# Patient Record
Sex: Male | Born: 1980 | Race: White | Hispanic: No | Marital: Married | Smoking: Current every day smoker
Health system: Southern US, Community
[De-identification: ages and names within clinical notes are randomized; demographics above are authoritative.]

## PROBLEM LIST (undated history)

## (undated) DIAGNOSIS — N200 Calculus of kidney: Secondary | ICD-10-CM

## (undated) DIAGNOSIS — R569 Unspecified convulsions: Secondary | ICD-10-CM

---

## 2006-06-21 ENCOUNTER — Emergency Department (HOSPITAL_COMMUNITY): Admission: EM | Admit: 2006-06-21 | Discharge: 2006-06-21 | Payer: Self-pay | Admitting: Emergency Medicine

## 2006-12-12 ENCOUNTER — Emergency Department (HOSPITAL_COMMUNITY): Admission: EM | Admit: 2006-12-12 | Discharge: 2006-12-12 | Payer: Self-pay | Admitting: Emergency Medicine

## 2007-01-28 ENCOUNTER — Emergency Department (HOSPITAL_COMMUNITY): Admission: EM | Admit: 2007-01-28 | Discharge: 2007-01-28 | Payer: Self-pay | Admitting: *Deleted

## 2007-04-19 ENCOUNTER — Emergency Department (HOSPITAL_COMMUNITY): Admission: EM | Admit: 2007-04-19 | Discharge: 2007-04-19 | Payer: Self-pay | Admitting: Emergency Medicine

## 2007-04-25 ENCOUNTER — Emergency Department (HOSPITAL_COMMUNITY): Admission: EM | Admit: 2007-04-25 | Discharge: 2007-04-25 | Payer: Self-pay | Admitting: Emergency Medicine

## 2007-04-27 ENCOUNTER — Emergency Department (HOSPITAL_COMMUNITY): Admission: EM | Admit: 2007-04-27 | Discharge: 2007-04-27 | Payer: Self-pay | Admitting: Emergency Medicine

## 2007-05-13 ENCOUNTER — Emergency Department (HOSPITAL_COMMUNITY): Admission: EM | Admit: 2007-05-13 | Discharge: 2007-05-13 | Payer: Self-pay | Admitting: Emergency Medicine

## 2012-01-13 ENCOUNTER — Ambulatory Visit: Payer: Self-pay | Admitting: Family Medicine

## 2012-01-13 VITALS — BP 152/72 | HR 71 | Temp 98.4°F | Resp 16 | Ht 75.0 in | Wt 238.0 lb

## 2012-01-13 DIAGNOSIS — M549 Dorsalgia, unspecified: Secondary | ICD-10-CM

## 2012-01-13 DIAGNOSIS — N200 Calculus of kidney: Secondary | ICD-10-CM

## 2012-01-13 LAB — POCT UA - MICROSCOPIC ONLY
Mucus, UA: NEGATIVE
WBC, Ur, HPF, POC: NEGATIVE

## 2012-01-13 LAB — POCT URINALYSIS DIPSTICK
Bilirubin, UA: NEGATIVE
Ketones, UA: NEGATIVE
Leukocytes, UA: NEGATIVE
pH, UA: 6

## 2012-01-13 MED ORDER — HYDROCODONE-ACETAMINOPHEN 5-500 MG PO TABS: 1.0000 | ORAL_TABLET | Freq: Three times a day (TID) | ORAL | Status: AC | PRN

## 2012-01-13 MED ORDER — TAMSULOSIN HCL 0.4 MG PO CAPS: 0.4000 mg | ORAL_CAPSULE | Freq: Every day | ORAL | Status: DC

## 2012-01-13 MED ORDER — NAPROXEN 500 MG PO TABS: 500.0000 mg | ORAL_TABLET | Freq: Two times a day (BID) | ORAL | Status: DC

## 2012-01-13 NOTE — Progress Notes (Signed)
  Subjective:    Patient ID: Allen Ferrell, male    DOB: [birth year], 31 y.o.   MRN: [id number]  HPI 31 yo male with question of kidney stone. Never had before.  2 days of right sided back pain, sharp, comes and goes.  Seems to be moving down.  Also, dysuria.  No fevers.  Slight nausea.  No abdominal pain.    Review of Systems Negative except as per HPI     Objective:   Physical Exam  Constitutional: Vital signs are normal. He appears well-developed and well-nourished. He is active.  Cardiovascular: Normal rate, regular rhythm, normal heart sounds and normal pulses.   Pulmonary/Chest: Effort normal and breath sounds normal.  Abdominal: Soft. Normal appearance and bowel sounds are normal. He exhibits no distension and no mass. There is no hepatosplenomegaly. There is no tenderness. There is no rigidity, no rebound, no guarding, no CVA tenderness, no tenderness at McBurney's point and negative Murphy's sign. No hernia.  Neurological: He is alert.    Positive ttp right lower back and CVA  Results for orders placed in visit on 01/13/12  POCT UA - MICROSCOPIC ONLY      Component Value Range   WBC, Ur, HPF, POC neg     RBC, urine, microscopic 15-20     Bacteria, U Microscopic trace     Mucus, UA neg     Epithelial cells, urine per micros 0-2     Crystals, Ur, HPF, POC neg     Casts, Ur, LPF, POC neg     Yeast, UA neg    POCT URINALYSIS DIPSTICK      Component Value Range   Color, UA yellow     Clarity, UA hazy     Glucose, UA neg     Bilirubin, UA neg     Ketones, UA neg     Spec Grav, UA 1.025     Blood, UA moderate     pH, UA 6.0     Protein, UA neg     Urobilinogen, UA 0.2     Nitrite, UA neg     Leukocytes, UA Negative         Assessment & Plan:  BAck pain, dysuria, hematuria - likely kidney stone.  Flomax, naproxyn, vicodin, fluids.  Discussed warning signs to return.  RTC if not passed in 1 week.

## 2012-01-14 ENCOUNTER — Telehealth: Payer: Self-pay

## 2012-01-14 NOTE — Telephone Encounter (Signed)
Pt wife state pt is in extreme pain he was seen 01-13-12 given rx for pain he has increased the dose by taking 2 at a time, he slept for awhile and still no relief, pt wife states they are from out of town and came to [place] for funeral, states they dont have any extra money and would like to see if something could be called [facility] [address]

## 2012-01-14 NOTE — Telephone Encounter (Signed)
He should RTC. ? Toradol injection.  Next pain medication that is stronger is Percocet and they would need to come in for that.

## 2012-01-14 NOTE — Telephone Encounter (Signed)
PT CALLED BACK WITH A BETTER NUMBER, [phone]

## 2012-01-16 ENCOUNTER — Other Ambulatory Visit: Payer: Self-pay | Admitting: Family Medicine

## 2012-01-16 MED ORDER — OXYCODONE-ACETAMINOPHEN 5-325 MG PO TABS: 1.0000 | ORAL_TABLET | Freq: Three times a day (TID) | ORAL | Status: AC | PRN

## 2012-01-16 MED ORDER — OXYCODONE-ACETAMINOPHEN 5-325 MG PO TABS: 1.0000 | ORAL_TABLET | Freq: Three times a day (TID) | ORAL | Status: DC | PRN

## 2012-01-16 NOTE — Telephone Encounter (Signed)
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  If that is no help, he will need to go to the ED. It can't be called.  They have to come in and pick it up.

## 2012-01-16 NOTE — Progress Notes (Signed)
Previous RX accidentally signed by another provider.  Original shredded and this one reprinted.

## 2012-01-16 NOTE — Telephone Encounter (Signed)
Advised pt that rx is ready for pick up.

## 2012-01-16 NOTE — Telephone Encounter (Signed)
Pt stated that he is still having pain and have not passed it yet.  He does not have the funds to come back in.  Would we be able to maybe refill pain med, he stated that it helped only a little bit.

## 2012-03-23 ENCOUNTER — Encounter (HOSPITAL_COMMUNITY): Payer: Self-pay | Admitting: *Deleted

## 2012-03-23 ENCOUNTER — Emergency Department (HOSPITAL_COMMUNITY)
Admission: EM | Admit: 2012-03-23 | Discharge: 2012-03-23 | Disposition: A | Discharge status: Home / Self Care | Payer: Self-pay | Attending: Emergency Medicine | Admitting: Emergency Medicine

## 2012-03-23 DIAGNOSIS — H9209 Otalgia, unspecified ear: Secondary | ICD-10-CM | POA: Insufficient documentation

## 2012-03-23 DIAGNOSIS — H6690 Otitis media, unspecified, unspecified ear: Secondary | ICD-10-CM

## 2012-03-23 DIAGNOSIS — F17200 Nicotine dependence, unspecified, uncomplicated: Secondary | ICD-10-CM | POA: Insufficient documentation

## 2012-03-23 MED ORDER — OFLOXACIN 0.3 % OP SOLN
5.0000 [drp] | Freq: Two times a day (BID) | OPHTHALMIC | Status: DC
  Administered 2012-03-23: 5 [drp] via OTIC
  Filled 2012-03-23: qty 5

## 2012-03-23 MED ORDER — ANTIPYRINE-BENZOCAINE 5.4-1.4 % OT SOLN
3.0000 [drp] | Freq: Once | OTIC | Status: AC
  Administered 2012-03-23: 3 [drp] via OTIC
  Filled 2012-03-23: qty 10

## 2012-03-23 MED ORDER — OFLOXACIN 0.3 % OT SOLN
5.0000 [drp] | Freq: Two times a day (BID) | OTIC | Status: DC
  Filled 2012-03-23: qty 5

## 2012-03-23 NOTE — ED Provider Notes (Signed)
History     CSN: [id number]  Arrival date & time 03/23/12  1341   First MD Initiated Contact with Patient 03/23/12 1528      Chief Complaint  Patient presents with  . Otalgia    (Consider location/radiation/quality/duration/timing/severity/associated sxs/prior treatment) HPI  31 year old male presents complaining of left ear pain. Pain is been ongoing for the past 3 days after he was cleaning his ear with a Q-tip. Patient reports hearing a muffled sound from his ear and also reports pus and blood drainage from the same ear.  Today patient felt dizzy with subjective fever and chills. He denies headache, pain when laying on his left ear, jaw pain, sinus pain, rash, or recent trauma with the exception of using Q-tip. He has not tried anything to alleviate his symptoms.  History reviewed. No pertinent past medical history.  History reviewed. No pertinent past surgical history.  History reviewed. No pertinent family history.  History  Substance Use Topics  . Smoking status: Current Everyday Smoker -- .5 years    Types: Cigarettes  . Smokeless tobacco: Not on file  . Alcohol Use: Yes     rarely      Review of Systems  All other systems reviewed and are negative.    Allergies  Review of patient's allergies indicates no known allergies.  Home Medications  No current outpatient prescriptions on file.  BP 136/68  Pulse 51  Temp 98.1 F (36.7 C) (Oral)  Resp 18  SpO2 100%  Physical Exam  Nursing note and vitals reviewed. Constitutional: He is oriented to person, place, and time. He appears well-developed and well-nourished. No distress.  HENT:  Head: Normocephalic and atraumatic.  Right Ear: External ear normal.  Left Ear: No drainage, swelling or tenderness. No foreign bodies. No mastoid tenderness. A middle ear effusion is present. Decreased hearing is noted.  Ears:  Mouth/Throat: Oropharynx is clear and moist. No oropharyngeal exudate.  Eyes: Conjunctivae are  normal.  Neck: Normal range of motion. Neck supple.  Pulmonary/Chest: Effort normal. No respiratory distress.  Lymphadenopathy:    He has no cervical adenopathy.  Neurological: He is alert and oriented to person, place, and time.  Skin: Skin is warm. No rash noted.    ED Course  Procedures (including critical care time)  Labs Reviewed - No data to display No results found.   No diagnosis found.    MDM  L ear pain due to recent Q-tip use.  Although TM were obscured by impacted cerumen, due to his sxs this does suggest AOM with possible TM perforation.  No evidence of Otitis Externa.  Will treat with Floxin otic and antipyrine.  Referral given.  Pt recommend avoid q-tip.  Pt voice understanding and agrees with plan.    Doubt mastoiditis, acoustic neuritis, or TMJ.  No fb seen.          Fayrene Helper, PA-C 03/23/12 1551

## 2012-03-23 NOTE — ED Notes (Signed)
Pt c/o left ear pain that began on Monday after cleaning ear with q-tip. Pt now reports muffled sounds from ear and reports blood draining and pus.

## 2012-03-23 NOTE — ED Provider Notes (Signed)
Medical screening examination/treatment/procedure(s) were performed by non-physician practitioner and as supervising physician I was immediately available for consultation/collaboration.  Maxi Rodas R. Robin Pafford, MD 03/23/12 2349 

## 2012-03-23 NOTE — Discharge Instructions (Signed)
Apply 3-4 drops of floxin otic ear drops to your left ear twice daily for the next 5 days.  You may apply antipyrine 2-3 drops every 6 hours for pain as needed.  If your symptom worsen, follow up with Dr. Jearld Fenton.  Avoid using Q-tip to clean ear.    Draining Ear Ear wax, pus, blood and other fluids are examples of the different types of drainage from ears. Drops or cream may be needed to lessen the itching which may occur with ear drainage. CAUSES   Skin irritations in the ear.   Ear infection.   Swimmer's ear.   Ruptured eardrum.   Foreign object in the ear canal.   Sudden pressure changes.   Head injury.  HOME CARE INSTRUCTIONS   Only take over-the-counter or prescription medicines for pain, fever, or discomfort as directed by your caregiver.   Do not rub the ear canal with cotton-tipped swabs.   Do not swim until your caregiver says it is okay.   Before you take a shower, cover a cotton ball with petroleum jelly to keep water out.   Limit exposure to smoke. Secondhand smoke can increase the chance for ear infections.   Keep up with immunizations.   Wash your hands well.   Keep all follow-up appointments to examine the ear and evaluate hearing.  SEEK MEDICAL CARE IF:   You have increased drainage.   You have ear pain, a fever, or drainage that is not getting better after 48 hours of antibiotics.   You are unusually tired.  SEEK IMMEDIATE MEDICAL CARE IF:  You have severe ear pain or headache.   The patient is older than 3 months with a rectal or oral temperature of 102 F (38.9 C) or higher.   The patient is 69 months old or younger with a rectal temperature of 100.4 F (38 C) or higher.   You vomit.   You feel dizzy.   You have a seizure.   You have new hearing loss.  MAKE SURE YOU:   Understand these instructions.   Will watch your condition.   Will get help right away if you are not doing well or get worse.  Document Released: 09/20/2005 Document  Revised: 09/09/2011 Document Reviewed: 07/24/2009 [facility] Patient Information 2012 [place], [place].  RESOURCE GUIDE  Chronic Pain Problems: Contact [facility]  [phone] Patients need to be referred by their primary care doctor.  Insufficient Money for Medicine: Contact United Way:  call "211" or [facility] 641-247-5871.  No Primary Care Doctor: - Call Health Connect  [phone] - can help you locate a primary care doctor that  accepts your insurance, provides certain services, etc. - Physician Referral [phone]  Agencies that provide inexpensive medical care: - [facility]  [phone] - [facility]  [phone] - [facility]  [phone] [facility]  [phone] - [facility]  [phone] [facility]  [phone]  Medicaid-accepting [facility] Providers: - [facility]- [address]  [phone], Mon-Fri 9am-7pm, Sat 9am-1pm - [facility]- [address] [place], [address] [place]  [phone] - [facility]- [address] [place]  [phone] [facility]- [address]  [phone] - Renaye Rakers- [address] [place], [address], [phone]  Only accepts [place] patients after they have their name  applied to their card  Self  Pay (no insurance) in [facility]: - Sickle Cell Patients: Dr Willey Blade, [facility]  [address] [place], [phone] - [facility]- [address] [place]  [phone]       [facility] [place]- [address]       -     [facility]- see information above (Speak to [employer] if you do not have insurance)       -  [facility]- [address] [place], [phone]       -  [facility]- [address],  [phone]       -  [facility]- [address] [phone]       -   Dr Julio Sicks-  [address], [place], [phone]       -  [facility]- [address], [phone]       -  [facility]- [address] [phone], also [address] [phone]       -    [facility]- [address] [place], [phone], 1st & 3rd Saturday   every month, 10am-1pm  1) Find a Doctor and Pay Out of Pocket Although you won't have to find out who is covered by your insurance plan, it is a good idea to ask around and get recommendations. You will then need to call the office and see if the doctor you have chosen will accept you as a new patient and what types of options they offer for patients who are self-pay. Some doctors offer discounts or will set up payment plans for their patients who do not have insurance, but you will need to ask so you aren't surprised when you get to your appointment.  2) Contact Your Local Health Department Not all health departments have doctors that can see patients for sick visits, but many do, so it is worth a call to see if yours does. If you don't know where your local health department is, you can check in your phone book. The CDC also has a tool to help you locate your state's health department, and many state websites also have listings of all of their local health departments.  3) Find a Walk-in Clinic If your illness is not likely to be very severe or complicated, you may want to try a walk in clinic. These are popping up all over the country in pharmacies, drugstores, and shopping centers. They're usually staffed by nurse practitioners or physician assistants that have been trained to treat common illnesses and complaints. They're usually fairly quick and inexpensive. However, if you have serious medical issues or chronic medical problems, these are probably not your best option  STD Testing - [facility] [address] Department of [facility] [place], STD Clinic, [address] [place],  phone [phone] or [phone].  Monday - Friday, call for an appointment. [facility] of [employer], STD Clinic, [address], [place], phone [phone] or [phone].  Monday - Friday, call for an appointment.  Abuse/Neglect: [facility] Child Abuse Hotline 2261909424 [facility] Child Abuse Hotline 979-609-2976 (After Hours)  Emergency Shelter:  [facility] (913) 104-8211  Maternity Homes: - [facility] [place] [facility] ([phone] - [facility] 7474037400  MRSA Hotline #:   843-075-7555  Aaron Edelman  [employer]  [facility] [place]  United [employer] [facility] Dept. [address]                 [address]         [address] [place] [address]  Blondell Reveal Phone:  [phone]                                  Phone:  [phone]                   Phone:  [phone]  [facility], [phone] - [facility] - [phone]       -     [facility] in [address], [address],                                  423-222-2133, [facility] Child Abuse Hotline (613) 420-0687 or (609)349-8732 (After Hours)   Behavioral Health Services  Substance Abuse Resources: - [facility] and Drug Services  616-875-4059 - [facility] 639-857-4823 - [facility] [place] [phone] Floydene Flock (615)[phone] - Residential & Outpatient Substance Abuse Program  ([phone]  Psychological Services: [facility]  [phone] Services  [phone] - [facility], [phone] [place]. [address] [place], ACCESS LINE: 323-347-9804 or 858-261-6181, [employer]  Dental Assistance  If unable to pay or uninsured, contact:   [facility] or [facility]. to become qualified for the adult dental clinic.  Patients with Medicaid: [facility] [phone], [phone] [address] [phone]  If unable to pay, or uninsured, contact [facility] 713-502-4528) or [facility] [phone] in [place], [phone] in [facility]) to become qualified for the adult dental clinic  Other Low-Cost Community Dental Services: - [facility]- [address] [place], [place], [place], [address], [phone], [phone], 2nd and 4th Thursday of the month at 6:30am.  10 clients each day by appointment, can sometimes see walk-in patients if someone does not show for an appointment. [facility]- [address] [place], [place], [address], [phone] - [facility]- [address] [place] [address] [place], [place], [address], [phone] - [facility]- [phone] [facility] Department- [phone] [facility] [phone] -

## 2012-06-05 ENCOUNTER — Encounter (HOSPITAL_COMMUNITY): Payer: Self-pay | Admitting: *Deleted

## 2012-06-05 ENCOUNTER — Emergency Department (HOSPITAL_COMMUNITY): Payer: Self-pay

## 2012-06-05 ENCOUNTER — Emergency Department (HOSPITAL_COMMUNITY)
Admission: EM | Admit: 2012-06-05 | Discharge: 2012-06-05 | Disposition: A | Discharge status: Home / Self Care | Payer: Self-pay | Attending: Emergency Medicine | Admitting: Emergency Medicine

## 2012-06-05 DIAGNOSIS — S6991XA Unspecified injury of right wrist, hand and finger(s), initial encounter: Secondary | ICD-10-CM

## 2012-06-05 DIAGNOSIS — W230XXA Caught, crushed, jammed, or pinched between moving objects, initial encounter: Secondary | ICD-10-CM | POA: Insufficient documentation

## 2012-06-05 DIAGNOSIS — S6990XA Unspecified injury of unspecified wrist, hand and finger(s), initial encounter: Secondary | ICD-10-CM | POA: Insufficient documentation

## 2012-06-05 DIAGNOSIS — F17200 Nicotine dependence, unspecified, uncomplicated: Secondary | ICD-10-CM | POA: Insufficient documentation

## 2012-06-05 MED ORDER — OXYCODONE-ACETAMINOPHEN 5-325 MG PO TABS
2.0000 | ORAL_TABLET | Freq: Once | ORAL | Status: AC
  Administered 2012-06-05: 2 via ORAL
  Filled 2012-06-05: qty 2

## 2012-06-05 MED ORDER — OXYCODONE-ACETAMINOPHEN 5-325 MG PO TABS: 1.0000 | ORAL_TABLET | Freq: Four times a day (QID) | ORAL | Status: AC | PRN

## 2012-06-05 MED ORDER — IBUPROFEN 800 MG PO TABS: 800.0000 mg | ORAL_TABLET | Freq: Three times a day (TID) | ORAL | Status: AC

## 2012-06-05 NOTE — ED Provider Notes (Signed)
Medical screening examination/treatment/procedure(s) were performed by non-physician practitioner and as supervising physician I was immediately available for consultation/collaboration.   Layana Konkel M [place] Madding, DO 06/05/12 1921 

## 2012-06-05 NOTE — Discharge Instructions (Signed)
If you notice increase swelling, warmth, numbness, tingling,  or inability to use your hand return to the ER!  Hand Injuries Minor fractures, sprains, bruises and burns of the hand are often managed in much the same way:  Elevation of your hand above the level of your heart for a few days after the injury, until the pain and swelling improve.   The use of hand dressings and splints to reduce motion, relieve painand prevent reinjury. The dressing and splint should not be removed without the caregiver's approval.   Application of ice packs for 20 to 30 minutes every few hours for 2 to 3 days to reduce pain and swelling due to fractures, sprains, and deep bruises.   The use of medicine to reduce pain and inflammation.  Early motion exercises are sometimes needed to reduce joint stiffness after a hand injury. However, your hand should not be used for any activities that increase pain. Document Released: 10/28/2004 Document Revised: 09/09/2011 Document Reviewed: 12/20/2008 [facility] Patient Information 2012 [place], [place].

## 2012-06-05 NOTE — ED Notes (Signed)
Last night moving refrig, tipped and caught rt hand between it and wall, pain with movement

## 2012-06-05 NOTE — ED Provider Notes (Signed)
History     CSN: [id number]  Arrival date & time 06/05/12  1045   First MD Initiated Contact with Patient 06/05/12 1235      Chief Complaint  Patient presents with  . Hand Injury    rt    (Consider location/radiation/quality/duration/timing/severity/associated sxs/prior treatment) HPI Comments: Allen Ferrell 31 y.o. male   The chief complaint is: Patient presents with:   Hand Injury - rt   The patient has medical history significant for:   History reviewed. No pertinent past medical history.  Patient presents with right hand injury after moving a refrigerator. Patient states that he caught his hand between the refrigerator and the wall and experienced extreme pain. He decribes it as 10/10 and throbbing. Patient history is significant for an old boxers fracture. Denies constitutional symptoms. Denies numbness or tingling. Denies decrease ROM. Denies loss of sensation.     The history is provided by the patient.    History reviewed. No pertinent past medical history.  History reviewed. No pertinent past surgical history.  No family history on file.  History  Substance Use Topics  . Smoking status: Current Everyday Smoker -- .5 years    Types: Cigarettes  . Smokeless tobacco: Not on file  . Alcohol Use: Yes     rarely      Review of Systems  Constitutional: Negative for fever, chills and diaphoresis.  Musculoskeletal: Positive for joint swelling and arthralgias.  Neurological: Negative for numbness.  All other systems reviewed and are negative.    Allergies  Review of patient's allergies indicates no known allergies.  Home Medications   Current Outpatient Rx  Name Route Sig Dispense Refill  . ACETAMINOPHEN 500 MG PO TABS Oral Take 1,500 mg by mouth every 6 (six) hours as needed. For pain      BP 137/71  Pulse 66  Temp 98.1 F (36.7 C) (Oral)  SpO2 100%  Physical Exam  Nursing note and vitals reviewed. Constitutional: He appears well-developed  and well-nourished. He appears distressed.  HENT:  Head: Normocephalic and atraumatic.  Mouth/Throat: Oropharynx is clear and moist.  Eyes: Conjunctivae are normal. No scleral icterus.  Cardiovascular: Normal rate, regular rhythm and normal heart sounds.   Pulmonary/Chest: Effort normal and breath sounds normal.  Abdominal: Soft. Bowel sounds are normal. There is no tenderness.  Musculoskeletal: Normal range of motion. He exhibits edema and tenderness.       Patient has normal flexion and extension of the fingers. Normal ROM of the wrist with strong radial pulse and good cap refill.  Neurological: He is alert.  Skin: Skin is warm and dry.    ED Course  Procedures (including critical care time)  Labs Reviewed - No data to display Dg Hand Complete Right  06/05/2012  *RADIOLOGY REPORT*  Clinical Data: Pain post blunt trauma.  RIGHT HAND - COMPLETE 3+ VIEW  Comparison: None.  Findings: Old fracture deformity of the right fifth metacarpal shaft.Negative for fracture, dislocation, or other acute abnormality.  Normal alignment and mineralization. No significant degenerative change.  Regional soft tissues unremarkable.  IMPRESSION:  Negative   Original Report Authenticated By: Thora Lance III, M.D.      1. Injury of hand, right       MDM  Patient presented with right hand injury after moving a fridge. Imaging unremarkable for new fracture. Patient given pain medication in ED with improvement. Patient discharged on pain medication. No red flags for fracture, dislocation, or subluxation. Return precautions given verbally  and in discharge summary.        Pixie Casino, PA-C 06/05/12 1559

## 2012-07-20 ENCOUNTER — Emergency Department (HOSPITAL_COMMUNITY)
Admission: EM | Admit: 2012-07-20 | Discharge: 2012-07-20 | Disposition: A | Discharge status: Home / Self Care | Payer: Self-pay | Attending: Emergency Medicine | Admitting: Emergency Medicine

## 2012-07-20 DIAGNOSIS — IMO0002 Reserved for concepts with insufficient information to code with codable children: Secondary | ICD-10-CM | POA: Insufficient documentation

## 2012-07-20 DIAGNOSIS — X500XXA Overexertion from strenuous movement or load, initial encounter: Secondary | ICD-10-CM | POA: Insufficient documentation

## 2012-07-20 DIAGNOSIS — Y9389 Activity, other specified: Secondary | ICD-10-CM | POA: Insufficient documentation

## 2012-07-20 DIAGNOSIS — Y998 Other external cause status: Secondary | ICD-10-CM | POA: Insufficient documentation

## 2012-07-20 DIAGNOSIS — S8390XA Sprain of unspecified site of unspecified knee, initial encounter: Secondary | ICD-10-CM

## 2012-07-20 MED ORDER — OXYCODONE-ACETAMINOPHEN 5-325 MG PO TABS: 1.0000 | ORAL_TABLET | Freq: Four times a day (QID) | ORAL | Status: DC | PRN

## 2012-07-20 MED ORDER — OXYCODONE-ACETAMINOPHEN 5-325 MG PO TABS
2.0000 | ORAL_TABLET | Freq: Once | ORAL | Status: AC
  Administered 2012-07-20: 2 via ORAL
  Filled 2012-07-20: qty 2

## 2012-07-20 NOTE — ED Notes (Signed)
Pt verbalizes understanding 

## 2012-07-20 NOTE — ED Notes (Signed)
Patient reports recently being seen at [facility] for a sprained knee.  Per patient, they referred him to an orthopaedist for pain control- but the orthopaedist won't write him a prescription for pain medicine, since they haven't seen him yet (and they can't see him for over a week).  Patient was prescribed Naproxen, but it is not controlling his pain.

## 2012-07-20 NOTE — ED Provider Notes (Signed)
History     CSN: [id number]  Arrival date & time 07/20/12  1810   First MD Initiated Contact with Patient 07/20/12 1823      Chief Complaint  Patient presents with  . 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 Patient complains of acute onset of right medial knee pain. He was prescribed naproxen at [facility] yesterday however this did not help and he is here requesting additional pain medication. Patient has orthopedic followup appointment on 10/24 in [facility]. He has knee immobilizer in place. Course is constant. Pain is currently 7/10. Walking or movement makes the pain worse.  The history is provided by the patient and the spouse.    No past medical history on file.  No past surgical history on file.  No family history on file.  History  Substance Use Topics  . Smoking status: Current Every Day Smoker -- .5 years    Types: Cigarettes  . Smokeless tobacco: Not on file  . Alcohol Use: Yes     rarely      Review of Systems  Constitutional: Negative for activity change.  HENT: Negative for neck pain.   Musculoskeletal: Positive for arthralgias and gait problem. Negative for back pain and joint swelling.  Skin: Negative for wound.  Neurological: Negative for weakness and numbness.    Allergies  Review of patient's allergies indicates no known allergies.  Home Medications   Current Outpatient Rx  Name Route Sig Dispense Refill  . ACETAMINOPHEN 500 MG PO TABS Oral Take 1,500 mg by mouth every 6 (six) hours as needed. For pain    . OXYCODONE-ACETAMINOPHEN 5-325 MG PO TABS Oral Take 1-2 tablets by mouth every 6 (six) hours as needed for pain. 12 tablet 0    BP 144/66  Pulse 71  Temp 98.2 F (36.8 C) (Oral)  Resp 16   SpO2 99%  Physical Exam  Nursing note and vitals reviewed. Constitutional: He appears well-developed and well-nourished.  HENT:  Head: Normocephalic and atraumatic.  Eyes: Conjunctivae normal are normal.  Neck: Normal range of motion. Neck supple.  Cardiovascular: Normal pulses.   Musculoskeletal: He exhibits tenderness. He exhibits no edema.       Right hip: Normal.       Right knee: He exhibits decreased range of motion (2/2 pain). He exhibits no swelling and no deformity. tenderness found. Medial joint line and MCL tenderness noted. No lateral joint line, no LCL and no patellar tendon tenderness noted.       Right ankle: Normal.       Legs: Neurological: He is alert. No sensory deficit.       Motor, sensation, and vascular distal to the injury is fully intact.   Skin: Skin is warm and dry.  Psychiatric: He has a normal mood and affect.    ED Course  Procedures (including critical care time)  Labs Reviewed - No data to display No results found.   1. Knee sprain    6:32 PM Patient seen and examined. Medications ordered. Patient has orthopedic appointment for 10/24. Patient advised to loosen immobilizer if it becomes painful or turns white or blue. Patient verbalizes understanding and agrees with plan.  Vital signs reviewed and are as follows: Filed Vitals:   07/20/12 1835  BP:  144/66  Pulse: 71  Temp: 98.2 F (36.8 C)  Resp: 16    MDM  Pain medication given for recent knee sprain. Patient already has immobilizer. He has orthopedic followup.        Renne Crigler, [place] 07/20/12 [phone]

## 2012-07-22 NOTE — ED Provider Notes (Signed)
Medical screening examination/treatment/procedure(s) were performed by non-physician practitioner and as supervising physician I was immediately available for consultation/collaboration.   Hurman Horn, MD 07/22/12 [phone]

## 2013-03-04 ENCOUNTER — Emergency Department (HOSPITAL_COMMUNITY)
Admission: EM | Admit: 2013-03-04 | Discharge: 2013-03-04 | Disposition: A | Discharge status: Home / Self Care | Payer: Self-pay | Attending: Emergency Medicine | Admitting: Emergency Medicine

## 2013-03-04 ENCOUNTER — Encounter (HOSPITAL_COMMUNITY): Payer: Self-pay | Admitting: *Deleted

## 2013-03-04 DIAGNOSIS — F17200 Nicotine dependence, unspecified, uncomplicated: Secondary | ICD-10-CM | POA: Insufficient documentation

## 2013-03-04 DIAGNOSIS — K089 Disorder of teeth and supporting structures, unspecified: Secondary | ICD-10-CM | POA: Insufficient documentation

## 2013-03-04 DIAGNOSIS — K0889 Other specified disorders of teeth and supporting structures: Secondary | ICD-10-CM

## 2013-03-04 MED ORDER — PENICILLIN V POTASSIUM 500 MG PO TABS: 500.0000 mg | ORAL_TABLET | Freq: Four times a day (QID) | ORAL | Status: DC

## 2013-03-04 MED ORDER — OXYCODONE-ACETAMINOPHEN 5-325 MG PO TABS: 1.0000 | ORAL_TABLET | Freq: Four times a day (QID) | ORAL | Status: DC | PRN

## 2013-03-04 NOTE — ED Provider Notes (Signed)
History     CSN: [id number]  Arrival date & time 03/04/13  1308   First MD Initiated Contact with Patient 03/04/13 1405      Chief Complaint  Patient presents with  . Dental Pain    (Consider location/radiation/quality/duration/timing/severity/associated sxs/prior treatment) HPI Comments: Patient presents to the emergency department with a dental complaint. Symptoms began Friday. The patient has tried to alleviate pain with BC powder and tylenol.  Pain rated at a 10/10, characterized as throbbing in nature and located bottom front, right of center. Patient denies fever, night sweats, chills, difficulty swallowing or opening mouth, SOB, nuchal rigidity or decreased ROM of neck.  Patient does not have a dentist and requests a resource guide at discharge.   Patient is a 32 y.o. male presenting with tooth pain.  Dental Pain Associated symptoms: no drooling, no facial swelling, no fever, no headaches and no neck pain     History reviewed. No pertinent past medical history.  History reviewed. No pertinent past surgical history.  No family history on file.  History  Substance Use Topics  . Smoking status: Current Every Day Smoker -- .5 years    Types: Cigarettes  . Smokeless tobacco: Not on file  . Alcohol Use: Yes     Comment: rarely      Review of Systems  Constitutional: Negative for fever.  HENT: Positive for dental problem. Negative for sore throat, facial swelling, drooling, trouble swallowing, neck pain, neck stiffness and voice change.   Eyes: Negative for photophobia and visual disturbance.  Respiratory: Negative for shortness of breath.   Cardiovascular: Negative for chest pain.  Gastrointestinal: Negative for nausea and vomiting.  Musculoskeletal: Negative for back pain.  Skin: Negative for rash.  Neurological: Negative for weakness, light-headedness, numbness and headaches.  Psychiatric/Behavioral: The patient is not nervous/anxious.     Allergies  Review of  patient's allergies indicates no known allergies.  Home Medications   Current Outpatient Rx  Name  Route  Sig  Dispense  Refill  . acetaminophen (TYLENOL) 500 MG tablet   Oral   Take 1,500 mg by mouth every 6 (six) hours as needed. For pain         . Aspirin-Salicylamide-Caffeine (BC HEADACHE PO)   Oral   Take by mouth.         . oxyCODONE-acetaminophen (PERCOCET) 5-325 MG per tablet   Oral   Take 1 tablet by mouth every 6 (six) hours as needed for pain.   6 tablet   0   . penicillin v potassium (VEETID) 500 MG tablet   Oral   Take 1 tablet (500 mg total) by mouth 4 (four) times daily.   40 tablet   0     BP 135/80  Pulse 95  Temp(Src) 98.2 F (36.8 C) (Oral)  Resp 18  Ht 6\' 3"  (1.905 m)  Wt 230 lb (104.327 kg)  BMI 28.75 kg/m2  SpO2 100%  Physical Exam  Nursing note and vitals reviewed. Constitutional: He is oriented to person, place, and time. He appears well-developed and well-nourished.  HENT:  Head: Normocephalic and atraumatic. No trismus in the jaw.  Mouth/Throat: No dental abscesses. No tonsillar abscesses.  Oropharynx is clear. Very tender at tooth #27 and 28 with erythema and edema of the underlying gingiva. Neck is nontender and supple without adenopathy or JVD.  Neck: Normal range of motion. Neck supple.  Cardiovascular: Normal rate.   Pulmonary/Chest: Effort normal. No respiratory distress. He has no wheezes.  Abdominal:  Soft. There is no tenderness.  Musculoskeletal: Normal range of motion.  Neurological: He is alert and oriented to person, place, and time. No cranial nerve deficit.  Skin: Skin is warm and dry.  Psychiatric: He has a normal mood and affect.    ED Course  Procedures (including critical care time)  Labs Reviewed - No data to display No results found.  Discharge Medication List as of 03/04/2013  2:08 PM    START taking these medications   Details  oxyCODONE-acetaminophen (PERCOCET) 5-325 MG per tablet Take 1 tablet by  mouth every 6 (six) hours as needed for pain., Starting 03/04/2013, Until Discontinued, Print    penicillin v potassium (VEETID) 500 MG tablet Take 1 tablet (500 mg total) by mouth 4 (four) times daily., Starting 03/04/2013, Until Discontinued, Print        1. Pain, dental       MDM  Patient with dental pain.  No gross abscess.  Exam unconcerning for Ludwig's angina or spread of infection.  Will treat with penicillin and pain medicine.  Urged patient to follow-up with dentist.  Provided resource material. Discussed options for treatment with pt who understands and is in agreement with discharge plan.          Glade Nurse, PA-C 03/04/13 1607

## 2013-03-04 NOTE — ED Notes (Signed)
Pt states started having a toothache Friday, bottom right side, states pain is severe.

## 2013-03-05 NOTE — ED Provider Notes (Signed)
Medical screening examination/treatment/procedure(s) were performed by non-physician practitioner and as supervising physician I was immediately available for consultation/collaboration.    Dulcie Gammon D Kieanna Rollo, MD 03/05/13 1057 

## 2013-05-19 ENCOUNTER — Emergency Department (HOSPITAL_COMMUNITY)
Admission: EM | Admit: 2013-05-19 | Discharge: 2013-05-19 | Disposition: A | Discharge status: Home / Self Care | Payer: Self-pay | Attending: Emergency Medicine | Admitting: Emergency Medicine

## 2013-05-19 ENCOUNTER — Emergency Department (HOSPITAL_COMMUNITY): Payer: Self-pay

## 2013-05-19 ENCOUNTER — Encounter (HOSPITAL_COMMUNITY): Payer: Self-pay | Admitting: *Deleted

## 2013-05-19 DIAGNOSIS — F17200 Nicotine dependence, unspecified, uncomplicated: Secondary | ICD-10-CM | POA: Insufficient documentation

## 2013-05-19 DIAGNOSIS — R569 Unspecified convulsions: Secondary | ICD-10-CM

## 2013-05-19 DIAGNOSIS — S42251A Displaced fracture of greater tuberosity of right humerus, initial encounter for closed fracture: Secondary | ICD-10-CM

## 2013-05-19 DIAGNOSIS — S298XXA Other specified injuries of thorax, initial encounter: Secondary | ICD-10-CM | POA: Insufficient documentation

## 2013-05-19 DIAGNOSIS — R1110 Vomiting, unspecified: Secondary | ICD-10-CM | POA: Insufficient documentation

## 2013-05-19 DIAGNOSIS — Y929 Unspecified place or not applicable: Secondary | ICD-10-CM | POA: Insufficient documentation

## 2013-05-19 DIAGNOSIS — S43005A Unspecified dislocation of left shoulder joint, initial encounter: Secondary | ICD-10-CM

## 2013-05-19 DIAGNOSIS — S42209A Unspecified fracture of upper end of unspecified humerus, initial encounter for closed fracture: Secondary | ICD-10-CM | POA: Insufficient documentation

## 2013-05-19 DIAGNOSIS — S43004A Unspecified dislocation of right shoulder joint, initial encounter: Secondary | ICD-10-CM

## 2013-05-19 DIAGNOSIS — Y939 Activity, unspecified: Secondary | ICD-10-CM | POA: Insufficient documentation

## 2013-05-19 DIAGNOSIS — X58XXXA Exposure to other specified factors, initial encounter: Secondary | ICD-10-CM | POA: Insufficient documentation

## 2013-05-19 DIAGNOSIS — Z792 Long term (current) use of antibiotics: Secondary | ICD-10-CM | POA: Insufficient documentation

## 2013-05-19 DIAGNOSIS — S43016A Anterior dislocation of unspecified humerus, initial encounter: Secondary | ICD-10-CM | POA: Insufficient documentation

## 2013-05-19 LAB — CBC WITH DIFFERENTIAL/PLATELET
Basophils Relative: 0 % (ref 0–1)
HCT: 43.9 % (ref 39.0–52.0)
Hemoglobin: 16.2 g/dL (ref 13.0–17.0)
Lymphs Abs: 1.7 10*3/uL (ref 0.7–4.0)
MCHC: 36.9 g/dL — ABNORMAL HIGH (ref 30.0–36.0)
Monocytes Absolute: 1.1 10*3/uL — ABNORMAL HIGH (ref 0.1–1.0)
Monocytes Relative: 8 % (ref 3–12)
Neutro Abs: 11.2 10*3/uL — ABNORMAL HIGH (ref 1.7–7.7)

## 2013-05-19 LAB — HEPATIC FUNCTION PANEL
AST: 22 U/L (ref 0–37)
Albumin: 5.1 g/dL (ref 3.5–5.2)
Alkaline Phosphatase: 60 U/L (ref 39–117)
Bilirubin, Direct: 0.1 mg/dL (ref 0.0–0.3)
Total Bilirubin: 0.8 mg/dL (ref 0.3–1.2)

## 2013-05-19 LAB — POCT I-STAT, CHEM 8
BUN: 9 mg/dL (ref 6–23)
Calcium, Ion: 1.12 mmol/L (ref 1.12–1.23)
Chloride: 100 mEq/L (ref 96–112)
Creatinine, Ser: 1.2 mg/dL (ref 0.50–1.35)
Glucose, Bld: 134 mg/dL — ABNORMAL HIGH (ref 70–99)

## 2013-05-19 MED ORDER — ONDANSETRON HCL 4 MG/2ML IJ SOLN
4.0000 mg | Freq: Once | INTRAMUSCULAR | Status: AC
  Administered 2013-05-19: 4 mg via INTRAVENOUS
  Filled 2013-05-19: qty 2

## 2013-05-19 MED ORDER — HYDROMORPHONE HCL PF 1 MG/ML IJ SOLN
1.0000 mg | Freq: Once | INTRAMUSCULAR | Status: AC
  Administered 2013-05-19: 1 mg via INTRAVENOUS
  Filled 2013-05-19: qty 1

## 2013-05-19 MED ORDER — PROPOFOL 10 MG/ML IV BOLUS: 0.5000 mg/kg | Freq: Once | INTRAVENOUS | Status: AC

## 2013-05-19 MED ORDER — FENTANYL CITRATE 0.05 MG/ML IJ SOLN
50.0000 ug | Freq: Once | INTRAMUSCULAR | Status: AC
  Administered 2013-05-19: 50 ug via INTRAVENOUS
  Filled 2013-05-19: qty 2

## 2013-05-19 MED ORDER — PROPOFOL 10 MG/ML IV BOLUS
INTRAVENOUS | Status: AC
  Administered 2013-05-19: 50 mg via INTRAVENOUS
  Filled 2013-05-19: qty 20

## 2013-05-19 MED ORDER — FENTANYL CITRATE 0.05 MG/ML IJ SOLN
50.0000 ug | Freq: Once | INTRAMUSCULAR | Status: AC
  Administered 2013-05-19: 50 ug via INTRAVENOUS

## 2013-05-19 MED ORDER — PROPOFOL 10 MG/ML IV EMUL
2.5000 mL | Freq: Once | INTRAVENOUS | Status: AC
  Administered 2013-05-19: 25 mg via INTRAVENOUS

## 2013-05-19 MED ORDER — OXYCODONE-ACETAMINOPHEN 5-325 MG PO TABS: 1.0000 | ORAL_TABLET | ORAL | Status: DC | PRN

## 2013-05-19 NOTE — ED Notes (Signed)
2nd dose propofol given, pt remains interactive, cooperative, VSS.

## 2013-05-19 NOTE — ED Notes (Signed)
Pt alert, NAD, calm, no changes, cooperative, coughs on command, licks top lip and bottom lip, VSS. Pt to xray for post reduction films.

## 2013-05-19 NOTE — ED Notes (Signed)
Pt here by EMS, restless in pain, c/o bilateral shoulder pain, pt's wife reported to EMS, pt had a sz around 2130, lasted ~ 1 minute, later they called EMS not for sz activity, but for bilateral shoulder pain, called out as CP, pt alert, noted distress, no dyspnea, restless, diaphoretic, having vomited with EMS PTA, ~ 300cc neon lime green, denies drug or ETOH use, 10/10 pain, interactive, cooperative.

## 2013-05-19 NOTE — ED Provider Notes (Addendum)
CSN: [id number]     Arrival date & time 05/19/13  0253 History     First MD Initiated Contact with Patient 05/19/13 0354     Chief Complaint  Patient presents with  . Shoulder Pain  . Chest Pain  . Emesis   (Consider location/radiation/quality/duration/timing/severity/associated sxs/prior Treatment) Patient is a 32 y.o. male presenting with shoulder pain, chest pain, and vomiting. The history is provided by the patient.  Shoulder Pain Associated symptoms include chest pain.  Chest Pain Associated symptoms: vomiting   Emesis  32 year old male was observed by his wife to have a seizure. He has no memory of the incident. He did bite his tongue but denies any bowel or bladder incontinence. He has not had seizures before. There is a family history of epilepsy with his father having had seizures. He is complaining of severe pain in both shoulders. Pain is rated at 10/10. It is worse with any attempted movement. Nothing makes it better. He is a cigarette smoker smoking one pack of cigarettes a day but denies alcohol and drug use.  History reviewed. No pertinent past medical history. History reviewed. No pertinent past surgical history. No family history on file. History  Substance Use Topics  . Smoking status: Current Every Day Smoker -- .5 years    Types: Cigarettes  . Smokeless tobacco: Not on file  . Alcohol Use: Yes     Comment: rarely    Review of Systems  Cardiovascular: Positive for chest pain.  Gastrointestinal: Positive for vomiting.  All other systems reviewed and are negative.    Allergies  Review of patient's allergies indicates no known allergies.  Home Medications   Current Outpatient Rx  Name  Route  Sig  Dispense  Refill  . acetaminophen (TYLENOL) 500 MG tablet   Oral   Take 1,500 mg by mouth every 6 (six) hours as needed. For pain         . Aspirin-Salicylamide-Caffeine (BC HEADACHE PO)   Oral   Take by mouth.         . oxyCODONE-acetaminophen  (PERCOCET) 5-325 MG per tablet   Oral   Take 1 tablet by mouth every 6 (six) hours as needed for pain.   6 tablet   0   . penicillin v potassium (VEETID) 500 MG tablet   Oral   Take 1 tablet (500 mg total) by mouth 4 (four) times daily.   40 tablet   0    BP 149/92  Pulse 81  Temp(Src) 97.9 F (36.6 C) (Oral)  Resp 15  SpO2 100% Physical Exam  Nursing note and vitals reviewed.  32 year old male, who appears to be uncomfortable, but is in no acute distress. Vital signs are significant for mild hypertension with blood pressure 149/92. Oxygen saturation is 100%, which is normal. Head is normocephalic and atraumatic. PERRLA, EOMI. Oropharynx is clear, but marks are present on the tongue consistent with tongue biting. Neck is nontender and supple without adenopathy or JVD. Back is nontender and there is no CVA tenderness. Lungs are clear without rales, wheezes, or rhonchi. Chest is nontender. Heart has regular rate and rhythm without murmur. Abdomen is soft, flat, nontender without masses or hepatosplenomegaly and peristalsis is normoactive. Extremities: Deformities are present of both shoulders consistent with dislocation. There is pain with any movement. Distal neurovascular exam is intact with strong pulses, prompt capillary refill, and normal sensation. Skin is warm and dry without rash. Neurologic: Mental status is normal, cranial nerves are intact,  there are no motor or sensory deficits.  ED Course   Reduction of dislocation Date/Time: 05/19/2013 5:00 AM Performed by: Dione Booze Authorized by: Preston Fleeting, Latissa Frick Consent: Verbal consent obtained. written consent not obtained. Risks and benefits: risks, benefits and alternatives were discussed Consent given by: patient Patient understanding: patient states understanding of the procedure being performed Patient consent: the patient's understanding of the procedure matches consent given Procedure consent: procedure consent  matches procedure scheduled Relevant documents: relevant documents present and verified Test results: test results available and properly labeled Site marked: the operative site was marked Imaging studies: imaging studies available Required items: required blood products, implants, devices, and special equipment available Patient identity confirmed: verbally with patient and arm band Time out: Immediately prior to procedure a "time out" was called to verify the correct patient, procedure, equipment, support staff and site/side marked as required. Patient sedated: yes Sedatives: propofol Analgesia: hydromorphone Sedation start date/time: 05/19/2013 5:00 AM Sedation end date/time: 05/19/2013 5:45 AM Patient tolerance: Patient tolerated the procedure well with no immediate complications. Comments: Bilateral shoulder dislocations were reduced with manipulation. The right shoulder reduction was done without difficulty. The left shoulder dislocation required several attempts at manipulation but successful reduction was obtained. Postreduction x-rays confirm adequate reduction. Hill-Sachs deformity is noted which is more prominent on the right. The avulsion fracture of the greater tuberosity of the right shoulder is in reasonable anatomic alignment following reduction.   (including critical care time)  Procedural sedation Performed by: ZOXWR,UEAVW Consent: Verbal consent obtained. Risks and benefits: risks, benefits and alternatives were discussed Required items: required blood products, implants, devices, and special equipment available Patient identity confirmed: arm band and provided demographic data Time out: Immediately prior to procedure a "time out" was called to verify the correct patient, procedure, equipment, support staff and site/side marked as required.  Sedation type: moderate (conscious) sedation NPO time confirmed and considedered  Sedatives: PROPOFOL  Physician Time at Bedside:  45 minutes  Vitals: Vital signs were monitored during sedation. Cardiac Monitor, pulse oximeter Patient tolerance: Patient tolerated the procedure well with no immediate complications. Comments: Pt with uneventful recovered. Returned to pre-procedural sedation baseline     Results for orders placed during the hospital encounter of 05/19/13  CBC WITH DIFFERENTIAL      Result Value Range   WBC 14.1 (*) 4.0 - 10.5 K/uL   RBC 5.12  4.22 - 5.81 MIL/uL   Hemoglobin 16.2  13.0 - 17.0 g/dL   HCT 09.8  11.9 - 14.7 %   MCV 85.7  78.0 - 100.0 fL   MCH 31.6  26.0 - 34.0 pg   MCHC 36.9 (*) 30.0 - 36.0 g/dL   RDW 82.9  56.2 - 13.0 %   Platelets 227  150 - 400 K/uL   Neutrophils Relative % 79 (*) 43 - 77 %   Neutro Abs 11.2 (*) 1.7 - 7.7 K/uL   Lymphocytes Relative 12  12 - 46 %   Lymphs Abs 1.7  0.7 - 4.0 K/uL   Monocytes Relative 8  3 - 12 %   Monocytes Absolute 1.1 (*) 0.1 - 1.0 K/uL   Eosinophils Relative 1  0 - 5 %   Eosinophils Absolute 0.1  0.0 - 0.7 K/uL   Basophils Relative 0  0 - 1 %   Basophils Absolute 0.0  0.0 - 0.1 K/uL  HEPATIC FUNCTION PANEL      Result Value Range   Total Protein 8.1  6.0 - 8.3 g/dL  Albumin 5.1  3.5 - 5.2 g/dL   AST 22  0 - 37 U/L   ALT 15  0 - 53 U/L   Alkaline Phosphatase 60  39 - 117 U/L   Total Bilirubin 0.8  0.3 - 1.2 mg/dL   Bilirubin, Direct 0.1  0.0 - 0.3 mg/dL   Indirect Bilirubin 0.7  0.3 - 0.9 mg/dL  ETHANOL      Result Value Range   Alcohol, Ethyl (B) <11  0 - 11 mg/dL  POCT I-STAT, CHEM 8      Result Value Range   Sodium 138  135 - 145 mEq/L   Potassium 3.9  3.5 - 5.1 mEq/L   Chloride 100  96 - 112 mEq/L   BUN 9  6 - 23 mg/dL   Creatinine, Ser 1.61  0.50 - 1.35 mg/dL   Glucose, Bld 096 (*) 70 - 99 mg/dL   Calcium, Ion 0.45  4.09 - 1.23 mmol/L   TCO2 26  0 - 100 mmol/L   Hemoglobin 16.7  13.0 - 17.0 g/dL   HCT 81.1  91.4 - 78.2 %  POCT I-STAT TROPONIN I      Result Value Range   Troponin i, poc 0.03  0.00 - 0.08 ng/mL    Comment 3            Dg Shoulder Right  05/19/2013   *RADIOLOGY REPORT*  Clinical Data: Postreduction  RIGHT SHOULDER - 2+ VIEW  Comparison: Prior radiograph performed earlier on the same day.  Findings: The humeral head is now in gross anatomic alignment with the glenoid status post reduction.  Previously identified impacted Hill-Sachs fracture is again noted, not significantly changed. Displaced greater trochanter fracture is grossly stable as well. No new fracture identified.  AC joint is approximated.  IMPRESSION: 1.  Humeral head in gross anatomic alignment status post reduction. 2.  Stable appearance of impacted Hill-Sachs fracture with displaced greater trochanter fracture fragment.   Original Report Authenticated By: Rise Mu, M.D.   Dg Shoulder Right  05/19/2013   *RADIOLOGY REPORT*  Clinical Data: Seizure, bilateral shoulder dislocation  RIGHT SHOULDER - 2+ VIEW  Comparison: Concurrently obtained radiographs of the chest and left shoulder  Findings: Anterior subcoracoid humeral head dislocation with impacted Hill-Sachs fracture on the anterior glenoid. The fracture extends through the greater trochanter which exists as a displaced fracture fragment.  IMPRESSION:  1.  Anterior sub coracoid humeral head dislocation with impacted Hill-Sachs fracture.  2.  The fracture extends through the greater trochanter which exists as a displaced fracture fragment.   Original Report Authenticated By: Malachy Moan, M.D.   Ct Head Wo Contrast  05/19/2013   *RADIOLOGY REPORT*  Clinical Data: Emesis  CT HEAD WITHOUT CONTRAST  Technique:  Contiguous axial images were obtained from the base of the skull through the vertex without contrast.  Comparison: Prior CT from 05/13/2007  Findings: There is no acute intracranial hemorrhage or infarct.  No midline shift or mass lesion.  CSF containing spaces are normal. Gray-white matter differentiation is preserved.  No extra-axial fluid collection.  Calvarium is  intact.  Orbital soft tissues are normal.  Scattered mucosal thickening is present within the ethmoidal air cells.  Paranasal sinuses are otherwise clear.  Mastoid air cells well pneumatized.  IMPRESSION: Normal head CT with no acute intracranial process.   Original Report Authenticated By: Rise Mu, M.D.   Dg Chest Portable 1 View  05/19/2013   *RADIOLOGY REPORT*  Clinical Data: Bilateral shoulder pain  status post seizure  PORTABLE CHEST - 1 VIEW  Comparison: None.  Findings: Abnormal position of the bilateral femoral heads concerning for bilateral the anterior subcoracoid shoulder dislocations.  Additionally, there is an acute fracture involving the greater tuberosity of the right humerus.  Recommend dedicated imaging of both shoulders.  The lungs are clear.  Cardiac and mediastinal contours within normal limits.  No acute rib fracture identified.  IMPRESSION:  1.  Abnormal position of the incompletely imaged humeral heads suggesting bilateral anterior sub coracoid shoulder dislocations.  2.  Acute displaced fracture through the greater trochanter of the right humerus.  Recommend dedicated bilateral shoulder imaging.   Original Report Authenticated By: Malachy Moan, M.D.   Dg Shoulder Left  05/19/2013   *RADIOLOGY REPORT*  Clinical Data: Postreduction  LEFT SHOULDER - 2+ VIEW  Comparison: Prior radiograph performed earlier on the same day.  Findings:  The humeral head is now in normal anatomic alignment with the glenoid., there is question of a mild associated Hill-Sachs deformity.  No Bankart lesion is identified.  AC joint is approximated.  IMPRESSION: 1.  Humeral head in normal anatomic alignment status post reduction. 2.  Question mild Hill-Sachs deformity.   Original Report Authenticated By: Rise Mu, M.D.   Dg Shoulder Left  05/19/2013   *RADIOLOGY REPORT*  Clinical Data: Seizure, bilateral shoulder dislocation  LEFT SHOULDER - 2+ VIEW  Comparison: Chest x-ray and right  shoulder radiographs obtained today  Findings: Acute anterior subcoracoid humeral head dislocation. There may be a mild Hill-Sachs deformity.  The clavicle acromioclavicular joint remains intact.  IMPRESSION:  Acute anterior subcoracoid humeral head dislocation.  Query mild Hill-Sachs fracture.   Original Report Authenticated By: Malachy Moan, M.D.    Images viewed by me, discussed with radiologist.   Date: 05/19/2013  Rate: 71  Rhythm: normal sinus rhythm  QRS Axis: normal  Intervals: normal  ST/T Wave abnormalities: nonspecific T wave changes  Conduction Disutrbances:none  Narrative Interpretation: Nonspecific T wave changes. No old ECG available for comparison.  Old EKG Reviewed: none available    1. Seizure   2. Closed dislocation of right shoulder, initial encounter   3. Closed dislocation of left shoulder, initial encounter   4. Fracture of greater tuberosity of humerus, right, closed, initial encounter     MDM  Seizure with bilateral shoulder dislocations. X-rays were obtained to rule out a fracture and to delineate nature of dislocation and procedural sedation will be needed to assist relocation. Seizure workup is initiated.  Seizure workup is unremarkable. Shoulder x-rays show an avulsion fracture of the greater tuberosity of the right humerus. Shoulder dislocations are reduced with postreduction x-rays confirming adequate reduction. I discussed the case with Dr. Charlann Boxer who is on call for orthopedics and he requests that the patient follow up with his associate, Dr. Elby Showers, in the next week. He is also referred to neurology to obtain an outpatient EEG. He is discharged with prescription for oxycodone-acetaminophen.  Dione Booze, MD 05/19/13 0981  Dione Booze, MD 05/19/13 [phone]

## 2013-05-19 NOTE — ED Notes (Signed)
R shoulder is reduced, pt verbalizes R shoulder feeling better, Dr. Preston Fleeting preparing to reduce L shoulder at this time.

## 2013-05-19 NOTE — ED Notes (Signed)
Back from xray, alert, NAD, calm, interactive, reports: "feeling sore", and "feel OK, pretty good", follows commands.

## 2013-05-19 NOTE — ED Notes (Signed)
Pt remains alert, NAD, calm, interactive, spoke with wife via phone per pt request, orders received and initiated.

## 2013-05-19 NOTE — ED Notes (Signed)
Pt in xray

## 2013-05-19 NOTE — Discharge Instructions (Signed)
You had a seizure today. Your evaluation did not find an obvious cause for the seizure. You need to have one other test to evaluate seizures and that this is a brain wave study, and EEG. Please call the neurologist to set up that test. Medication is not usually given after an initial seizure because about half of the people who have one seizure will never have another. If you have a second seizure, you would need to be on medication. [place] law is that she may not drive until you have gone 6 months without a seizure.  You were shoulders were both dislocated. There is also a fracture of the right shoulder. He need to follow up with the orthopedic Dr. in this coming week for further evaluation.  Seizure, Adult A seizure is abnormal electrical activity in the brain. Seizures can cause a change in attention or behavior (altered mental status). Seizures often involve uncontrollable shaking (convulsions). Seizures usually last from 30 seconds to 2 minutes. Epilepsy is a brain disorder in which a patient has repeated seizures over time. CAUSES  There are many different problems that can cause seizures. In some cases, no cause is found. Common causes of seizures include:  Head injuries.  Brain tumors.  Infections.  Imbalance of chemicals in the blood.  Kidney failure or liver failure.  Heart disease.  Drug abuse.  Stroke.  Withdrawal from certain drugs or alcohol.  Birth defects.  Malfunction of a neurosurgical device placed in the brain. SYMPTOMS  Symptoms vary depending on the part of the brain that is involved. Right before a seizure, you may have a warning (aura) that a seizure is about to occur. An aura may include the following symptoms:   Fear or anxiety.  Nausea.  Feeling like the room is spinning (vertigo).  Vision changes, such as seeing flashing lights or spots. Common symptoms during a seizure include:  Convulsions.  Drooling.  Rapid eye  movements.  Grunting.  Loss of bladder and bowel control.  Bitter taste in the mouth. After a seizure, you may feel confused and sleepy. You may also have an injury resulting from convulsions during the seizure. DIAGNOSIS  Your caregiver will perform a physical exam and run some tests to determine the type and cause of your seizure. These tests may include:  Blood tests.  A lumbar puncture test. In this test, a small amount of fluid is removed from the spine and examined.  Electrocardiography (ECG). This test records the electrical activity in your heart.  Imaging tests, such as computed tomography (CT) scans or magnetic resonance imaging (MRI).  Electroencephalography (EEG). This test records the electrical activity in your brain. TREATMENT  Seizures usually stop on their own. Treatment will depend on the cause of your seizure. In some cases, medicine may be given to prevent future seizures. HOME CARE INSTRUCTIONS   If you are given medicines, take them exactly as prescribed by your caregiver.  Keep all follow-up appointments as directed by your caregiver.  Do not swim or drive until your caregiver says it is okay.  Teach friends and family what to do if you have a seizure. They should:  Lay you on the ground to prevent a fall.  Put a cushion under your head.  Loosen any tight clothing around your neck.  Turn you on your side. If vomiting occurs, this helps keep your airway clear.  Stay with you until you recover. SEEK IMMEDIATE MEDICAL CARE IF:  The seizure lasts longer than 2  to 5 minutes.  The seizure is severe or the person does not wake up after the seizure.  The person has altered mental status. Drive the person to the emergency department or call your local emergency services (911 in [place] MAKE SURE YOU:  Understand these instructions.  Will watch your condition.  Will get help right away if you are not doing well or get worse. Document Released:  09/17/2000 Document Revised: 12/13/2011 Document Reviewed: 09/08/2011 [facility] Patient Information 2014 [place], [place].  Shoulder Dislocation Your shoulder is made up of three bones: the collar bone (clavicle); the shoulder blade (scapula), which includes the socket (glenoid cavity); and the upper arm bone (humerus). Your shoulder joint is the place where these bones meet. Strong, fibrous tissues hold these bones together (ligaments). Muscles and strong, fibrous tissues that connect the muscles to these bones (tendons) allow your arm to move through this joint. The range of motion of your shoulder joint is more extensive than most of your other joints, and the glenoid cavity is very shallow. That is the reason that your shoulder joint is one of the most unstable joints in your body. It is far more prone to dislocation than your other joints. Shoulder dislocation is when your humerus is forced out of your shoulder joint. CAUSES Shoulder dislocation is caused by a forceful impact on your shoulder. This impact usually is from an injury, such as a sports injury or a fall. SYMPTOMS Symptoms of shoulder dislocation include:  Deformity of your shoulder.  Intense pain.  Inability to move your shoulder joint.  Numbness, weakness, or tingling around your shoulder joint (your neck or down your arm).  Bruising or swelling around your shoulder. DIAGNOSIS In order to diagnose a dislocated shoulder, your caregiver will perform a physical exam. Your caregiver also may have an X-ray exam done to see if you have any broken bones. Magnetic resonance imaging (MRI) is a procedure that sometimes is done to help your caregiver see any damage to the soft tissues around your shoulder, particularly your rotator cuff tendons. Additionally, your caregiver also may have electromyography done to measure the electrical discharges produced in your muscles if you have signs or symptoms of nerve damage. TREATMENT A shoulder  dislocation is treated by placing the humerus back in the joint (reduction). Your caregiver does this either manually (closed reduction), by moving your humerus back into the joint through manipulation, or through surgery (open reduction). When your humerus is back in place, severe pain should improve almost immediately. You also may need to have surgery if you have a weak shoulder joint or ligaments, and you have recurring shoulder dislocations, despite rehabilitation. In rare cases, surgery is necessary if your nerves or blood vessels are damaged during the dislocation. After your reduction, your arm will be placed in a shoulder immobilizer or sling to keep it from moving. Your caregiver will have you wear your shoulder immoblizer or sling for 3 days to 3 weeks, depending on how serious your dislocation is. When your shoulder immobilizer or sling is removed, your caregiver may prescribe physical therapy to help improve the range of motion in your shoulder joint. HOME CARE INSTRUCTIONS  The following measures can help to reduce pain and speed up the healing process:  Rest your injured joint. Do not move it. Avoid activities similar to the one that caused your injury.  Apply ice to your injured joint for the first day or two after your reduction or as directed by your caregiver. Applying ice  helps to reduce inflammation and pain.  Put ice in a plastic bag.  Place a towel between your skin and the bag.  Leave the ice on for 15-20 minutes at a time, every 2 hours while you are awake.  Exercise your hand by squeezing a soft ball. This helps to eliminate stiffness and swelling in your hand and wrist.  Take over-the-counter or prescription medicine for pain or discomfort as told by your caregiver. SEEK IMMEDIATE MEDICAL CARE IF:   Your shoulder immobilizer or sling becomes damaged.  Your pain becomes worse rather than better.  You lose feeling in your arm or hand, or they become white and  cold. MAKE SURE YOU:   Understand these instructions.  Will watch your condition.  Will get help right away if you are not doing well or get worse. Document Released: 06/15/2001 Document Revised: 12/13/2011 Document Reviewed: 07/11/2011 [facility] Patient Information 2014 [place], [place].  Acetaminophen; Oxycodone tablets What is this medicine? ACETAMINOPHEN; OXYCODONE (a set a MEE noe fen; ox i KOE done) is a pain reliever. It is used to treat mild to moderate pain. This medicine may be used for other purposes; ask your health care provider or pharmacist if you have questions. What should I tell my health care provider before I take this medicine? They need to know if you have any of these conditions: -brain tumor -Crohn's disease, inflammatory bowel disease, or ulcerative colitis -drink more than 3 alcohol containing drinks per day -drug abuse or addiction -head injury -heart or circulation problems -kidney disease or problems going to the bathroom -liver disease -lung disease, asthma, or breathing problems -an unusual or allergic reaction to acetaminophen, oxycodone, other opioid analgesics, other medicines, foods, dyes, or preservatives -pregnant or trying to get pregnant -breast-feeding How should I use this medicine? Take this medicine by mouth with a full glass of water. Follow the directions on the prescription label. Take your medicine at regular intervals. Do not take your medicine more often than directed. Talk to your pediatrician regarding the use of this medicine in children. Special care may be needed. Patients over 62 years old may have a stronger reaction and need a smaller dose. Overdosage: If you think you have taken too much of this medicine contact a poison control center or emergency room at once. NOTE: This medicine is only for you. Do not share this medicine with others. What if I miss a dose? If you miss a dose, take it as soon as you can. If it is almost  time for your next dose, take only that dose. Do not take double or extra doses. What may interact with this medicine? -alcohol or medicines that contain alcohol -antihistamines -barbiturates like amobarbital, butalbital, butabarbital, methohexital, pentobarbital, phenobarbital, thiopental, and secobarbital -benztropine -drugs for bladder problems like solifenacin, trospium, oxybutynin, tolterodine, hyoscyamine, and methscopolamine -drugs for breathing problems like ipratropium and tiotropium -drugs for certain stomach or intestine problems like propantheline, homatropine methylbromide, glycopyrrolate, atropine, belladonna, and dicyclomine -general anesthetics like etomidate, ketamine, nitrous oxide, propofol, desflurane, enflurane, halothane, isoflurane, and sevoflurane -medicines for depression, anxiety, or psychotic disturbances -medicines for pain like codeine, morphine, pentazocine, buprenorphine, butorphanol, nalbuphine, tramadol, and propoxyphene -medicines for sleep -muscle relaxants -naltrexone -phenothiazines like perphenazine, thioridazine, chlorpromazine, mesoridazine, fluphenazine, prochlorperazine, promazine, and trifluoperazine -scopolamine -trihexyphenidyl This list may not describe all possible interactions. Give your health care provider a list of all the medicines, herbs, non-prescription drugs, or dietary supplements you use. Also tell them if you smoke, drink alcohol, or use  illegal drugs. Some items may interact with your medicine. What should I watch for while using this medicine? Tell your doctor or health care professional if your pain does not go away, if it gets worse, or if you have new or a different type of pain. You may develop tolerance to the medicine. Tolerance means that you will need a higher dose of the medication for pain relief. Tolerance is normal and is expected if you take this medicine for a long time. Do not suddenly stop taking your medicine because  you may develop a severe reaction. Your body becomes used to the medicine. This does NOT mean you are addicted. Addiction is a behavior related to getting and using a drug for a nonmedical reason. If you have pain, you have a medical reason to take pain medicine. Your doctor will tell you how much medicine to take. If your doctor wants you to stop the medicine, the dose will be slowly lowered over time to avoid any side effects. You may get drowsy or dizzy. Do not drive, use machinery, or do anything that needs mental alertness until you know how this medicine affects you. Do not stand or sit up quickly, especially if you are an older patient. This reduces the risk of dizzy or fainting spells. Alcohol may interfere with the effect of this medicine. Avoid alcoholic drinks. The medicine will cause constipation. Try to have a bowel movement at least every 2 to 3 days. If you do not have a bowel movement for 3 days, call your doctor or health care professional. Do not take Tylenol (acetaminophen) or medicines that have acetaminophen with this medicine. Too much acetaminophen can be very dangerous. Many nonprescription medicines contain acetaminophen. Always read the labels carefully to avoid taking more acetaminophen. What side effects may I notice from receiving this medicine? Side effects that you should report to your doctor or health care professional as soon as possible: -allergic reactions like skin rash, itching or hives, swelling of the face, lips, or tongue -breathing difficulties, wheezing -confusion -light headedness or fainting spells -severe stomach pain -yellowing of the skin or the whites of the eyes Side effects that usually do not require medical attention (report to your doctor or health care professional if they continue or are bothersome): -dizziness -drowsiness -nausea -vomiting This list may not describe all possible side effects. Call your doctor for medical advice about side  effects. You may report side effects to FDA at 1-800-FDA-1088. Where should I keep my medicine? Keep out of the reach of children. This medicine can be abused. Keep your medicine in a safe place to protect it from theft. Do not share this medicine with anyone. Selling or giving away this medicine is dangerous and against the law. Store at room temperature between 20 and 25 degrees C (68 and 77 degrees F). Keep container tightly closed. Protect from light. Flush any unused medicines down the toilet. Do not use the medicine after the expiration date. NOTE: This sheet is a summary. It may not cover all possible information. If you have questions about this medicine, talk to your doctor, pharmacist, or health care provider.  2012, [employer]. (08/19/2008 10:01:21 AM)

## 2013-05-19 NOTE — ED Notes (Signed)
Back from xray, now to CT.

## 2013-05-19 NOTE — ED Notes (Signed)
L shoulder reduced, pt verbalizes L shoulder feeling better.

## 2013-05-19 NOTE — ED Notes (Signed)
Pt remains alert, NAD, calm, interactive, cooperative, follows commands, bilateral shoulder immobilizers applied, in place.

## 2013-05-19 NOTE — ED Notes (Signed)
Back from CT, EDP aware, ortho tech to BS, returned to monitor, O2 Kamas in place, pt alert, appropriate, follows commands, interactive. VSS.

## 2013-05-19 NOTE — ED Notes (Signed)
R shoulder reduced w/o difficulty, first attempt.

## 2013-05-23 ENCOUNTER — Emergency Department (HOSPITAL_COMMUNITY)
Admission: EM | Admit: 2013-05-23 | Discharge: 2013-05-23 | Disposition: A | Discharge status: Home / Self Care | Payer: Self-pay | Attending: Emergency Medicine | Admitting: Emergency Medicine

## 2013-05-23 ENCOUNTER — Encounter (HOSPITAL_COMMUNITY): Payer: Self-pay

## 2013-05-23 DIAGNOSIS — Y9389 Activity, other specified: Secondary | ICD-10-CM | POA: Insufficient documentation

## 2013-05-23 DIAGNOSIS — F17200 Nicotine dependence, unspecified, uncomplicated: Secondary | ICD-10-CM | POA: Insufficient documentation

## 2013-05-23 DIAGNOSIS — S42253A Displaced fracture of greater tuberosity of unspecified humerus, initial encounter for closed fracture: Secondary | ICD-10-CM | POA: Insufficient documentation

## 2013-05-23 DIAGNOSIS — M25511 Pain in right shoulder: Secondary | ICD-10-CM

## 2013-05-23 DIAGNOSIS — S42251S Displaced fracture of greater tuberosity of right humerus, sequela: Secondary | ICD-10-CM

## 2013-05-23 DIAGNOSIS — R569 Unspecified convulsions: Secondary | ICD-10-CM | POA: Insufficient documentation

## 2013-05-23 DIAGNOSIS — X58XXXA Exposure to other specified factors, initial encounter: Secondary | ICD-10-CM | POA: Insufficient documentation

## 2013-05-23 DIAGNOSIS — Y9210 Unspecified residential institution as the place of occurrence of the external cause: Secondary | ICD-10-CM | POA: Insufficient documentation

## 2013-05-23 MED ORDER — OXYCODONE-ACETAMINOPHEN 5-325 MG PO TABS
1.0000 | ORAL_TABLET | Freq: Once | ORAL | Status: AC
  Administered 2013-05-23: 1 via ORAL
  Filled 2013-05-23: qty 1

## 2013-05-23 MED ORDER — OXYCODONE-ACETAMINOPHEN 5-325 MG PO TABS: 1.0000 | ORAL_TABLET | Freq: Three times a day (TID) | ORAL | Status: DC | PRN

## 2013-05-23 NOTE — ED Notes (Addendum)
Pt c/o Right shoulder pain, states he was here Friday for a seizure. States bilateral shoulder "popped out" during his seizure and both were put back in place, pt presents with Right shoulder sling. Pt reports increase pain and is unable to f/u with [facility] until Monday. Pt states he is out of his medication and can't get a refill until Monday

## 2013-05-23 NOTE — ED Provider Notes (Signed)
CSN: [id number]     Arrival date & time 05/23/13  2034 History     First MD Initiated Contact with Patient 05/23/13 2104     Chief Complaint  Patient presents with  . Shoulder Pain   (Consider location/radiation/quality/duration/timing/severity/associated sxs/prior Treatment) The history is provided by the patient. No language interpreter was used.  Allen Ferrell is a 32 y/o M presenting to the ED, with wife, with ongoing bilateral shoulder pain, right more so than the left, since 05/19/2013. Patient was in the hospital on 05/19/2013 when he experienced a seizure that led to bilateral shoulder dislocations, closed, with a closed fracture to the greater tuberosity of the right shoulder - both shoulders were reduced, patient placed in sling immobilizer. Patient reported that the right shoulder is worse than the left - reported that the right shoulder is a constant intense aching sensation with radiation down the to elbow. Patient reported that his left shoulder feels as if it keeps coming out of place, reported that since event he has felt his shoulder come out at least 4 times - reported that once it occurred with moving a pillow. Patient reported that the pain medications works, reported that he ran out of Percocets yesterday morning, has been using BC powder and Tylenol since he ran out of pain medications today. Denied fever, chills, chest pain, shortness of breath, difficulty breathing, abdominal pain, nausea, vomiting, loss of sensation, weakness, fall, re-injury. Denied recent seizure activity.  PCP none    Patient reported that he has an appointment with Dr. Kirtland Bouchard. Supple, orthopedics, on 05/28/2013. Denied calling and setting up an appointment with Dr. Elvera Lennox. Tat, neurology.   History reviewed. No pertinent past medical history. History reviewed. No pertinent past surgical history. History reviewed. No pertinent family history. History  Substance Use Topics  . Smoking status: Current Every Day  Smoker -- .5 years    Types: Cigarettes  . Smokeless tobacco: Not on file  . Alcohol Use: Yes     Comment: rarely    Review of Systems  Constitutional: Negative for fever and chills.  HENT: Negative for trouble swallowing and neck stiffness.   Eyes: Negative for visual disturbance.  Respiratory: Negative for chest tightness and shortness of breath.   Cardiovascular: Negative for chest pain.  Genitourinary: Negative for decreased urine volume and difficulty urinating.  Musculoskeletal: Positive for arthralgias (bilateral shoulders, right more than left ).  Neurological: Negative for dizziness, weakness and headaches.  All other systems reviewed and are negative.    Allergies  Review of patient's allergies indicates no known allergies.  Home Medications   Current Outpatient Rx  Name  Route  Sig  Dispense  Refill  . Aspirin-Salicylamide-Caffeine (ARTHRITIS STRENGTH BC POWDER PO)   Oral   Take 1 packet by mouth daily as needed (shoulder pain).         Marland Kitchen oxyCODONE-acetaminophen (PERCOCET) 5-325 MG per tablet   Oral   Take 1 tablet by mouth every 4 (four) hours as needed for pain.   20 tablet   0   . oxyCODONE-acetaminophen (PERCOCET/ROXICET) 5-325 MG per tablet   Oral   Take 1 tablet by mouth every 8 (eight) hours as needed for pain.   15 tablet   0    BP 154/99  Pulse 64  Temp(Src) 98.6 F (37 C) (Oral)  Resp 18  SpO2 97% Physical Exam  Nursing note and vitals reviewed. Constitutional: He is oriented to person, place, and time. He appears well-developed and well-nourished.  No distress.  Patient sitting upright in chair with sling immobilizer on the right arm.   HENT:  Head: Normocephalic and atraumatic.  Eyes: Conjunctivae and EOM are normal. Pupils are equal, round, and reactive to light. Right eye exhibits no discharge. Left eye exhibits no discharge.  Neck: Normal range of motion. Neck supple. No tracheal deviation present.    Negative neck stiffness    Negative nuchal rigidity Mild discomfort upon palpation to the right side of the neck, muscular in nature   Cardiovascular: Normal rate, regular rhythm and normal heart sounds.  Exam reveals no friction rub.   No murmur heard. Pulses:      Radial pulses are 2+ on the right side, and 2+ on the left side.       Dorsalis pedis pulses are 2+ on the right side, and 2+ on the left side.  Pulmonary/Chest: Effort normal and breath sounds normal. No respiratory distress. He has no wheezes. He has no rales. He exhibits no tenderness.  Musculoskeletal: Normal range of motion. He exhibits tenderness.       Arms: Swelling noted to the right shoulder. Pain upon palpation to the right shoulder, anteriorly and posteriorly. Decreased ROM to the right shoulder secondary to pain - full ROM to the right elbow, wrist and digits without discomfort noted.  Negative swelling or deformities noted to the left shoulder - negative sunken in appearance. Left shoulder full ROM, discomfort noted with extension of the left shoulder. Negative drop arm test. Discomfort with apprehension test.    Lymphadenopathy:    He has no cervical adenopathy.  Neurological: He is alert and oriented to person, place, and time. No cranial nerve deficit. He exhibits normal muscle tone. Coordination normal.  Strength 5+/5+ to LUE Strength 5+/5+ to right elbow, wrist, and digits. Sensation intact to upper and lower extremities bilaterally with differentiation to sharp and dull touch.  Patient alert and oriented - follows commands - responds appropriately to questions.   Skin: Skin is warm and dry. No rash noted. He is not diaphoretic. No erythema.  Ecchymosis noted to the inner aspect of the right upper arm, healing ecchymosis  Psychiatric: He has a normal mood and affect. His behavior is normal. Thought content normal.    ED Course   Procedures (including critical care time)  Medications  oxyCODONE-acetaminophen (PERCOCET/ROXICET)  5-325 MG per tablet 1 tablet (1 tablet Oral Given 05/23/13 2153)    Labs Reviewed - No data to display No results found. 1. Shoulder pain, right   2. Greater tuberosity of humerus fracture, right, sequela     MDM  Patient presenting to the ED with right shoulder pain that has been ongoing since 05/19/2013 after a seizure episode, patient's first, that results in bilateral closed shoulder dislocation and closed greater tuberosity fracture of the right humerus. Patient was placed in sling and given pain medications.  Alert and oriented. Full ROM to the left shoulder with negative crepitus noted - negative deformities or sunken in appearance noted. Negative drop arm test of left shoulder, positive apprehension test with left shoulder. Right shoulder swelling and ecchymosis noted to the medial aspect of the upper arm - decreased ROM secondary to pain - full ROM to the elbow, wrist and digits. Patient in sling immobilizer. Strength intact. Sensation intact. Pulses palpable. Negative neurological deficits noted.  No imaging needed at this time since most recent imaging noted, from 05/19/2013. Patient has not had any recent injuries and falls for required imaging - as per  patient report. Patient stable, afebrile. Right shoulder pain secondary to dislocation with proper reduction and closed fracture of greater tuberosity - discussed with patient that between the fracture and the bilateral shoulder dislocations that he is going to be in pain. Discharged patient with pain medications - discussed with patient precautions, disposal, course. Discussed with patient to keep right arm in sling immobilizer at all times. Discussed with patient to rest and avoid any strenuous activity, no heavy lifting or lifting at all. Discussed with patient to rest and stay hydrated. Discussed with patient to keep appointment with Dr. Rennis Chris on 05/28/2013 and instructed patient make an appointment with Dr. Arbutus Leas, neurology. Discussed with  patient to continue to monitor symptoms and if symptoms are to worsen or change to report back to the ED - strict return instructions.  Patient agreed to plan of care, understood, all questions answered.    Raymon Mutton, PA-C 05/24/13 [phone]

## 2013-05-28 NOTE — ED Provider Notes (Signed)
Medical screening examination/treatment/procedure(s) were performed by non-physician practitioner and as supervising physician I was immediately available for consultation/collaboration.    Vida Roller, MD 05/28/13 [phone]

## 2013-06-05 ENCOUNTER — Other Ambulatory Visit (HOSPITAL_COMMUNITY): Payer: Self-pay | Admitting: Orthopedic Surgery

## 2013-06-05 DIAGNOSIS — M25511 Pain in right shoulder: Secondary | ICD-10-CM

## 2013-06-12 ENCOUNTER — Ambulatory Visit (HOSPITAL_COMMUNITY): Admission: RE | Admit: 2013-06-12 | Payer: Self-pay | Source: Ambulatory Visit

## 2013-06-22 ENCOUNTER — Ambulatory Visit (HOSPITAL_COMMUNITY)
Admission: RE | Admit: 2013-06-22 | Discharge: 2013-06-22 | Disposition: A | Discharge status: Home / Self Care | Payer: Self-pay | Source: Ambulatory Visit | Attending: Orthopedic Surgery | Admitting: Orthopedic Surgery

## 2013-06-22 ENCOUNTER — Ambulatory Visit (HOSPITAL_COMMUNITY)
Admission: RE | Admit: 2013-06-22 | Discharge: 2013-06-22 | Disposition: A | Discharge status: Home / Self Care | Payer: Medicaid Other | Source: Ambulatory Visit | Attending: Orthopedic Surgery | Admitting: Orthopedic Surgery

## 2013-06-22 DIAGNOSIS — S43439A Superior glenoid labrum lesion of unspecified shoulder, initial encounter: Secondary | ICD-10-CM | POA: Insufficient documentation

## 2013-06-22 DIAGNOSIS — S42253A Displaced fracture of greater tuberosity of unspecified humerus, initial encounter for closed fracture: Secondary | ICD-10-CM | POA: Insufficient documentation

## 2013-06-22 DIAGNOSIS — X58XXXA Exposure to other specified factors, initial encounter: Secondary | ICD-10-CM | POA: Insufficient documentation

## 2013-06-22 DIAGNOSIS — M25511 Pain in right shoulder: Secondary | ICD-10-CM

## 2013-12-05 ENCOUNTER — Emergency Department (HOSPITAL_COMMUNITY)
Admission: EM | Admit: 2013-12-05 | Discharge: 2013-12-05 | Disposition: A | Discharge status: Home / Self Care | Payer: Medicaid Other | Attending: Emergency Medicine | Admitting: Emergency Medicine

## 2013-12-05 ENCOUNTER — Encounter (HOSPITAL_COMMUNITY): Payer: Self-pay | Admitting: Emergency Medicine

## 2013-12-05 DIAGNOSIS — K089 Disorder of teeth and supporting structures, unspecified: Secondary | ICD-10-CM | POA: Insufficient documentation

## 2013-12-05 DIAGNOSIS — Z87828 Personal history of other (healed) physical injury and trauma: Secondary | ICD-10-CM | POA: Insufficient documentation

## 2013-12-05 DIAGNOSIS — F17200 Nicotine dependence, unspecified, uncomplicated: Secondary | ICD-10-CM | POA: Insufficient documentation

## 2013-12-05 DIAGNOSIS — K0889 Other specified disorders of teeth and supporting structures: Secondary | ICD-10-CM

## 2013-12-05 DIAGNOSIS — K029 Dental caries, unspecified: Secondary | ICD-10-CM | POA: Insufficient documentation

## 2013-12-05 HISTORY — DX: Unspecified convulsions: R56.9

## 2013-12-05 MED ORDER — OXYCODONE-ACETAMINOPHEN 5-325 MG PO TABS
1.0000 | ORAL_TABLET | Freq: Once | ORAL | Status: AC
  Administered 2013-12-05: 1 via ORAL
  Filled 2013-12-05: qty 1

## 2013-12-05 MED ORDER — PENICILLIN V POTASSIUM 500 MG PO TABS: 500.0000 mg | ORAL_TABLET | Freq: Three times a day (TID) | ORAL | Status: DC

## 2013-12-05 MED ORDER — PENICILLIN V POTASSIUM 500 MG PO TABS
500.0000 mg | ORAL_TABLET | Freq: Once | ORAL | Status: AC
  Administered 2013-12-05: 500 mg via ORAL
  Filled 2013-12-05: qty 1

## 2013-12-05 MED ORDER — OXYCODONE-ACETAMINOPHEN 5-325 MG PO TABS: 1.0000 | ORAL_TABLET | ORAL | Status: DC | PRN

## 2013-12-05 NOTE — ED Notes (Signed)
Pt states that he thinks he broke a tooth several months ago when he had a seizure. Started having pain on R lower side last night.

## 2013-12-05 NOTE — ED Provider Notes (Signed)
Medical screening examination/treatment/procedure(s) were performed by non-physician practitioner and as supervising physician I was immediately available for consultation/collaboration.  Flint MelterElliott L Daltin Crist, MD 12/05/13 2352

## 2013-12-05 NOTE — ED Provider Notes (Signed)
CSN: [id number]     Arrival date & time 12/05/13  2026 History  This chart was scribed for non-physician practitioner, Elpidio AnisShari Lydon Vansickle, PA-C,working with Flint MelterElliott L Wentz, MD, by Karle PlumberJennifer Tensley, ED Scribe.  This patient was seen in room WTR9/WTR9 and the patient's care was started at 10:04 PM.  Chief Complaint  Patient presents with  . Dental Pain   The history is provided by the patient. No language interpreter was used.   HPI Comments:  Allen Ferrell is a 33 y.o. male who presents to the Emergency Department complaining of dental pain to the lower right side for approximately 24 hours. He states he cracked a tooth several months ago while having a seizure. Pt denies facial swelling or fever. Pt states he is a smoker.  Past Medical History  Diagnosis Date  . Seizures    No past surgical history on file. No family history on file. History  Substance Use Topics  . Smoking status: Current Every Day Smoker -- .5 years    Types: Cigarettes  . Smokeless tobacco: Not on file  . Alcohol Use: Yes     Comment: rarely    Review of Systems  Constitutional: Negative for fever.  HENT: Positive for dental problem. Negative for facial swelling.   All other systems reviewed and are negative.    Allergies  Review of patient's allergies indicates no known allergies.  Home Medications   Current Outpatient Rx  Name  Route  Sig  Dispense  Refill  . Aspirin-Salicylamide-Caffeine (ARTHRITIS STRENGTH BC POWDER PO)   Oral   Take 1 packet by mouth daily as needed (shoulder pain).         Marland Kitchen. ibuprofen (ADVIL,MOTRIN) 200 MG tablet   Oral   Take 1,000 mg by mouth every 6 (six) hours as needed for moderate pain.          Triage Vitals: BP 177/94  Pulse 68  Temp(Src) 98.4 F (36.9 C) (Oral)  SpO2 98% Physical Exam  Nursing note and vitals reviewed. Constitutional: He is oriented to person, place, and time. He appears well-developed and well-nourished.  HENT:  Head: Normocephalic and  atraumatic.  Lower right, rear molar moderately decayed.  Eyes: EOM are normal.  Neck: Normal range of motion.  Cardiovascular: Normal rate.   Pulmonary/Chest: Effort normal.  Musculoskeletal: Normal range of motion.  Neurological: He is alert and oriented to person, place, and time.  Skin: Skin is warm and dry.  Psychiatric: He has a normal mood and affect. His behavior is normal.    ED Course  Procedures (including critical care time) DIAGNOSTIC STUDIES: Oxygen Saturation is 98% on RA, normal by my interpretation.   COORDINATION OF CARE: 10:06 PM- Will prescribe antibiotics and pain medication. Pt verbalizes understanding and agrees to plan.  Medications - No data to display  Labs Review Labs Reviewed - No data to display Imaging Review No results found.   EKG Interpretation None      MDM   Final diagnoses:  None    1. Dental pain  Uncomplicated dental pain from decay. No drainage abscess.   I personally performed the services described in this documentation, which was scribed in my presence. The recorded information has been reviewed and is accurate.    Arnoldo HookerShari A Amair Shrout, PA-C 12/05/13 2211

## 2013-12-05 NOTE — Discharge Instructions (Signed)
Dental Care and Dentist Visits °Dental care supports good overall health. Regular dental visits can also help you avoid dental pain, bleeding, infection, and other more serious health problems in the future. It is important to keep the mouth healthy because diseases in the teeth, gums, and other oral tissues can spread to other areas of the body. Some problems, such as diabetes, heart disease, and pre-term labor have been associated with poor oral health.  °See your dentist every 6 months. If you experience emergency problems such as a toothache or broken tooth, go to the dentist right away. If you see your dentist regularly, you may catch problems early. It is easier to be treated for problems in the early stages.  °WHAT TO EXPECT AT A DENTIST VISIT  °Your dentist will look for many common oral health problems and recommend proper treatment. At your regular dental visit, you can expect: °· Gentle cleaning of the teeth and gums. This includes scraping and polishing. This helps to remove the sticky substance around the teeth and gums (plaque). Plaque forms in the mouth shortly after eating. Over time, plaque hardens on the teeth as tartar. If tartar is not removed regularly, it can cause problems. Cleaning also helps remove stains. °· Periodic X-rays. These pictures of the teeth and supporting bone will help your dentist assess the health of your teeth. °· Periodic fluoride treatments. Fluoride is a natural mineral shown to help strengthen teeth. Fluoride treatment involves applying a fluoride gel or varnish to the teeth. It is most commonly done in children. °· Examination of the mouth, tongue, jaws, teeth, and gums to look for any oral health problems, such as: °· Cavities (dental caries). This is decay on the tooth caused by plaque, sugar, and acid in the mouth. It is best to catch a cavity when it is small. °· Inflammation of the gums caused by plaque buildup (gingivitis). °· Problems with the mouth or malformed  or misaligned teeth. °· Oral cancer or other diseases of the soft tissues or jaws.  °KEEP YOUR TEETH AND GUMS HEALTHY °For healthy teeth and gums, follow these general guidelines as well as your dentist's specific advice: °· Have your teeth professionally cleaned at the dentist every 6 months. °· Brush twice daily with a fluoride toothpaste. °· Floss your teeth daily.  °· Ask your dentist if you need fluoride supplements, treatments, or fluoride toothpaste. °· Eat a healthy diet. Reduce foods and drinks with added sugar. °· Avoid smoking. °TREATMENT FOR ORAL HEALTH PROBLEMS °If you have oral health problems, treatment varies depending on the conditions present in your teeth and gums. °· Your caregiver will most likely recommend good oral hygiene at each visit. °· For cavities, gingivitis, or other oral health disease, your caregiver will perform a procedure to treat the problem. This is typically done at a separate appointment. Sometimes your caregiver will refer you to another dental specialist for specific tooth problems or for surgery. °SEEK IMMEDIATE DENTAL CARE IF: °· You have pain, bleeding, or soreness in the gum, tooth, jaw, or mouth area. °· A permanent tooth becomes loose or separated from the gum socket. °· You experience a blow or injury to the mouth or jaw area. °Document Released: 06/02/2011 Document Revised: 12/13/2011 Document Reviewed: 06/02/2011 °ExitCare® Patient Information ©2014 [employer]. ° °Dental Caries  °Dental caries (also called tooth decay) is the most common oral disease. It can occur at any age, but is more common in children and young adults.  °HOW DENTAL CARIES DEVELOPS  °The   process of decay begins when bacteria and foods (particularly sugars and starches) combine in your mouth to produce plaque. Plaque is a substance that sticks to the hard, outer surface of a tooth (enamel). The bacteria in plaque produce acids that attack enamel. These acids may also attack the root surface of  a tooth (cementum) if it is exposed. Repeated attacks dissolve these surfaces and create holes in the tooth (cavities). If left untreated, the acids destroy the other layers of the tooth.  °RISK FACTORS °· Frequent sipping of sugary beverages.   °· Frequent snacking on sugary and starchy foods, especially those that easily get stuck in the teeth.   °· Poor oral hygiene.   °· Dry mouth.   °· Substance abuse such as methamphetamine abuse.   °· Broken or poor-fitting dental restorations.   °· Eating disorders.   °· Gastroesophageal reflux disease (GERD).   °· Certain radiation treatments to the head and neck. °SYMPTOMS °In the early stages of dental caries, symptoms are seldom present. Sometimes white, chalky areas may be seen on the enamel or other tooth layers. In later stages, symptoms may include: °· Pits and holes on the enamel. °· Toothache after sweet, hot, or cold foods or drinks are consumed. °· Pain around the tooth. °· Swelling around the tooth. °DIAGNOSIS  °Most of the time, dental caries is detected during a regular dental checkup. A diagnosis is made after a thorough medical and dental history is taken and the surfaces of your teeth are checked for signs of dental caries. Sometimes special instruments, such as lasers, are used to check for dental caries. Dental X-ray exams may be taken so that areas not visible to the eye (such as between the contact areas of the teeth) can be checked for cavities.  °TREATMENT  °If dental caries is in its early stages, it may be reversed with a fluoride treatment or an application of a remineralizing agent at the dental office. Thorough brushing and flossing at home is needed to aid these treatments. If it is in its later stages, treatment depends on the location and extent of tooth destruction:  °· If a small area of the tooth has been destroyed, the destroyed area will be removed and cavities will be filled with a material such as gold, silver amalgam, or composite  resin.   °· If a large area of the tooth has been destroyed, the destroyed area will be removed and a cap (crown) will be fitted over the remaining tooth structure.   °· If the center part of the tooth (pulp) is affected, a procedure called a root canal will be needed before a filling or crown can be placed.   °· If most of the tooth has been destroyed, the tooth may need to be pulled (extracted). °HOME CARE INSTRUCTIONS °You can prevent, stop, or reverse dental caries at home by practicing good oral hygiene. Good oral hygiene includes: °· Thoroughly cleaning your teeth at least twice a day with a toothbrush and dental floss.   °· Using a fluoride toothpaste. A fluoride mouth rinse may also be used if recommended by your dentist or health care provider.   °· Restricting the amount of sugary and starchy foods and sugary liquids you consume.   °· Avoiding frequent snacking on these foods and sipping of these liquids.   °· Keeping regular visits with a dentist for checkups and cleanings. °PREVENTION  °· Practice good oral hygiene. °· Consider a dental sealant. A dental sealant is a coating material that is applied by your dentist to the pits and grooves   of teeth. The sealant prevents food from being trapped in them. It may protect the teeth for several years. °· Ask about fluoride supplements if you live in a community without fluorinated water or with water that has a low fluoride content. Use fluoride supplements as directed by your dentist or health care provider. °· Allow fluoride varnish applications to teeth if directed by your dentist or health care provider. °Document Released: 06/12/2002 Document Revised: 05/23/2013 Document Reviewed: 09/22/2012 °ExitCare® Patient Information ©2014 [employer]. ° °

## 2013-12-05 NOTE — ED Notes (Signed)
Pt states he has a ride home

## 2014-04-01 ENCOUNTER — Emergency Department (HOSPITAL_COMMUNITY)
Admission: EM | Admit: 2014-04-01 | Discharge: 2014-04-01 | Disposition: A | Discharge status: Home / Self Care | Payer: Medicaid Other | Attending: Emergency Medicine | Admitting: Emergency Medicine

## 2014-04-01 ENCOUNTER — Encounter (HOSPITAL_COMMUNITY): Payer: Self-pay | Admitting: Emergency Medicine

## 2014-04-01 ENCOUNTER — Emergency Department (HOSPITAL_COMMUNITY): Payer: Medicaid Other

## 2014-04-01 ENCOUNTER — Encounter (HOSPITAL_COMMUNITY)
Admission: EM | Disposition: A | Discharge status: Home / Self Care | Payer: Self-pay | Source: Home / Self Care | Attending: Emergency Medicine

## 2014-04-01 ENCOUNTER — Emergency Department (HOSPITAL_COMMUNITY): Payer: Medicaid Other | Admitting: Anesthesiology

## 2014-04-01 ENCOUNTER — Encounter (HOSPITAL_COMMUNITY): Payer: Medicaid Other | Admitting: Anesthesiology

## 2014-04-01 ENCOUNTER — Ambulatory Visit (HOSPITAL_COMMUNITY): Payer: Medicaid Other

## 2014-04-01 DIAGNOSIS — F17200 Nicotine dependence, unspecified, uncomplicated: Secondary | ICD-10-CM | POA: Insufficient documentation

## 2014-04-01 DIAGNOSIS — N4400 Torsion of testis, unspecified: Secondary | ICD-10-CM | POA: Insufficient documentation

## 2014-04-01 HISTORY — DX: Calculus of kidney: N20.0

## 2014-04-01 HISTORY — PX: ORCHIOPEXY: SHX479

## 2014-04-01 LAB — URINALYSIS, ROUTINE W REFLEX MICROSCOPIC
Bilirubin Urine: NEGATIVE
Glucose, UA: NEGATIVE mg/dL
HGB URINE DIPSTICK: NEGATIVE
Ketones, ur: NEGATIVE mg/dL
LEUKOCYTES UA: NEGATIVE
NITRITE: NEGATIVE
Protein, ur: NEGATIVE mg/dL
SPECIFIC GRAVITY, URINE: 1.022 (ref 1.005–1.030)
UROBILINOGEN UA: 0.2 mg/dL (ref 0.0–1.0)
pH: 7 (ref 5.0–8.0)

## 2014-04-01 LAB — CBC WITH DIFFERENTIAL/PLATELET
BASOS ABS: 0 10*3/uL (ref 0.0–0.1)
Basophils Relative: 0 % (ref 0–1)
Eosinophils Absolute: 0.1 10*3/uL (ref 0.0–0.7)
Eosinophils Relative: 1 % (ref 0–5)
HCT: 39.9 % (ref 39.0–52.0)
Hemoglobin: 13.5 g/dL (ref 13.0–17.0)
LYMPHS ABS: 0.9 10*3/uL (ref 0.7–4.0)
LYMPHS PCT: 8 % — AB (ref 12–46)
MCH: 29.4 pg (ref 26.0–34.0)
MCHC: 33.8 g/dL (ref 30.0–36.0)
MCV: 86.9 fL (ref 78.0–100.0)
Monocytes Absolute: 0.6 10*3/uL (ref 0.1–1.0)
Monocytes Relative: 6 % (ref 3–12)
NEUTROS PCT: 85 % — AB (ref 43–77)
Neutro Abs: 9.1 10*3/uL — ABNORMAL HIGH (ref 1.7–7.7)
PLATELETS: 167 10*3/uL (ref 150–400)
RBC: 4.59 MIL/uL (ref 4.22–5.81)
RDW: 12.5 % (ref 11.5–15.5)
WBC: 10.7 10*3/uL — AB (ref 4.0–10.5)

## 2014-04-01 LAB — BASIC METABOLIC PANEL
BUN: 9 mg/dL (ref 6–23)
CALCIUM: 9.5 mg/dL (ref 8.4–10.5)
CO2: 29 mEq/L (ref 19–32)
Chloride: 102 mEq/L (ref 96–112)
Creatinine, Ser: 0.86 mg/dL (ref 0.50–1.35)
Glucose, Bld: 112 mg/dL — ABNORMAL HIGH (ref 70–99)
Potassium: 3.6 mEq/L — ABNORMAL LOW (ref 3.7–5.3)
Sodium: 143 mEq/L (ref 137–147)

## 2014-04-01 SURGERY — ORCHIOPEXY ADULT
Anesthesia: General | Site: Scrotum | Laterality: Bilateral

## 2014-04-01 MED ORDER — ACETAMINOPHEN 650 MG RE SUPP: 650.0000 mg | RECTAL | Status: DC | PRN

## 2014-04-01 MED ORDER — SODIUM CHLORIDE 0.9 % IV SOLN: 250.0000 mL | INTRAVENOUS | Status: DC | PRN

## 2014-04-01 MED ORDER — FENTANYL CITRATE 0.05 MG/ML IJ SOLN
25.0000 ug | INTRAMUSCULAR | Status: DC | PRN
Start: 2014-04-01 — End: 2014-04-01

## 2014-04-01 MED ORDER — ROCURONIUM BROMIDE 100 MG/10ML IV SOLN
INTRAVENOUS | Status: DC | PRN
  Administered 2014-04-01: 2 mg via INTRAVENOUS
  Administered 2014-04-01: 28 mg via INTRAVENOUS

## 2014-04-01 MED ORDER — ONDANSETRON HCL 4 MG/2ML IJ SOLN
4.0000 mg | Freq: Once | INTRAMUSCULAR | Status: AC
  Administered 2014-04-01: 4 mg via INTRAVENOUS
  Filled 2014-04-01: qty 2

## 2014-04-01 MED ORDER — FENTANYL CITRATE 0.05 MG/ML IJ SOLN
INTRAMUSCULAR | Status: AC
  Filled 2014-04-01: qty 5

## 2014-04-01 MED ORDER — OXYCODONE HCL 5 MG/5ML PO SOLN
5.0000 mg | Freq: Once | ORAL | Status: DC | PRN
  Filled 2014-04-01: qty 5

## 2014-04-01 MED ORDER — FENTANYL CITRATE 0.05 MG/ML IJ SOLN
INTRAMUSCULAR | Status: DC | PRN
  Administered 2014-04-01 (×2): 100 ug via INTRAVENOUS
  Administered 2014-04-01: 50 ug via INTRAVENOUS

## 2014-04-01 MED ORDER — MEPERIDINE HCL 50 MG/ML IJ SOLN: 6.2500 mg | INTRAMUSCULAR | Status: DC | PRN

## 2014-04-01 MED ORDER — OXYCODONE-ACETAMINOPHEN 5-325 MG PO TABS: 1.0000 | ORAL_TABLET | ORAL | Status: AC | PRN

## 2014-04-01 MED ORDER — KETOROLAC TROMETHAMINE 30 MG/ML IJ SOLN
30.0000 mg | Freq: Once | INTRAMUSCULAR | Status: AC
  Administered 2014-04-01: 30 mg via INTRAVENOUS

## 2014-04-01 MED ORDER — MIDAZOLAM HCL 5 MG/5ML IJ SOLN
INTRAMUSCULAR | Status: DC | PRN
  Administered 2014-04-01: 2 mg via INTRAVENOUS

## 2014-04-01 MED ORDER — PROPOFOL 10 MG/ML IV BOLUS
INTRAVENOUS | Status: AC
  Filled 2014-04-01: qty 20

## 2014-04-01 MED ORDER — HYDROMORPHONE HCL PF 1 MG/ML IJ SOLN
INTRAMUSCULAR | Status: DC
Start: 2014-04-01 — End: 2014-04-01
  Filled 2014-04-01: qty 1

## 2014-04-01 MED ORDER — DEXAMETHASONE SODIUM PHOSPHATE 10 MG/ML IJ SOLN
INTRAMUSCULAR | Status: AC
  Filled 2014-04-01: qty 1

## 2014-04-01 MED ORDER — PROMETHAZINE HCL 25 MG/ML IJ SOLN: 6.2500 mg | INTRAMUSCULAR | Status: DC | PRN

## 2014-04-01 MED ORDER — DEXAMETHASONE SODIUM PHOSPHATE 10 MG/ML IJ SOLN
INTRAMUSCULAR | Status: DC | PRN
  Administered 2014-04-01: 10 mg via INTRAVENOUS

## 2014-04-01 MED ORDER — BUPIVACAINE-EPINEPHRINE 0.25% -1:200000 IJ SOLN
INTRAMUSCULAR | Status: AC
  Filled 2014-04-01: qty 1

## 2014-04-01 MED ORDER — LACTATED RINGERS IV SOLN
INTRAVENOUS | Status: DC
  Administered 2014-04-01: 13:00:00 via INTRAVENOUS

## 2014-04-01 MED ORDER — GLYCOPYRROLATE 0.2 MG/ML IJ SOLN
INTRAMUSCULAR | Status: AC
  Filled 2014-04-01: qty 1

## 2014-04-01 MED ORDER — SUCCINYLCHOLINE CHLORIDE 20 MG/ML IJ SOLN
INTRAMUSCULAR | Status: DC | PRN
  Administered 2014-04-01: 140 mg via INTRAVENOUS

## 2014-04-01 MED ORDER — OXYCODONE HCL 5 MG PO TABS: 5.0000 mg | ORAL_TABLET | Freq: Once | ORAL | Status: DC | PRN

## 2014-04-01 MED ORDER — ATROPINE SULFATE 0.4 MG/ML IJ SOLN
INTRAMUSCULAR | Status: DC | PRN
  Administered 2014-04-01: 0.4 mg via INTRAVENOUS

## 2014-04-01 MED ORDER — CEFAZOLIN SODIUM-DEXTROSE 2-3 GM-% IV SOLR
INTRAVENOUS | Status: AC
  Filled 2014-04-01: qty 50

## 2014-04-01 MED ORDER — ONDANSETRON HCL 4 MG/2ML IJ SOLN
INTRAMUSCULAR | Status: DC | PRN
  Administered 2014-04-01: 4 mg via INTRAVENOUS

## 2014-04-01 MED ORDER — OXYCODONE HCL 5 MG PO TABS
5.0000 mg | ORAL_TABLET | ORAL | Status: DC | PRN
  Administered 2014-04-01: 5 mg via ORAL

## 2014-04-01 MED ORDER — HYDROMORPHONE HCL PF 1 MG/ML IJ SOLN
0.2500 mg | INTRAMUSCULAR | Status: DC | PRN
  Administered 2014-04-01 (×4): 0.5 mg via INTRAVENOUS

## 2014-04-01 MED ORDER — HYDROMORPHONE HCL PF 2 MG/ML IJ SOLN
INTRAMUSCULAR | Status: AC
  Filled 2014-04-01: qty 1

## 2014-04-01 MED ORDER — NEOSTIGMINE METHYLSULFATE 10 MG/10ML IV SOLN
INTRAVENOUS | Status: DC | PRN
  Administered 2014-04-01: 3.5 mg via INTRAVENOUS

## 2014-04-01 MED ORDER — ONDANSETRON HCL 4 MG/2ML IJ SOLN
INTRAMUSCULAR | Status: AC
  Filled 2014-04-01: qty 2

## 2014-04-01 MED ORDER — ACETAMINOPHEN 325 MG PO TABS: 650.0000 mg | ORAL_TABLET | ORAL | Status: DC | PRN

## 2014-04-01 MED ORDER — LACTATED RINGERS IV SOLN
INTRAVENOUS | Status: DC | PRN
  Administered 2014-04-01: 12:00:00 via INTRAVENOUS

## 2014-04-01 MED ORDER — SODIUM CHLORIDE 0.9 % IV BOLUS (SEPSIS)
1000.0000 mL | Freq: Once | INTRAVENOUS | Status: AC
  Administered 2014-04-01: 500 mL via INTRAVENOUS

## 2014-04-01 MED ORDER — GLYCOPYRROLATE 0.2 MG/ML IJ SOLN
INTRAMUSCULAR | Status: DC | PRN
  Administered 2014-04-01: 0.4 mg via INTRAVENOUS
  Administered 2014-04-01: 0.2 mg via INTRAVENOUS

## 2014-04-01 MED ORDER — HYDROMORPHONE HCL PF 1 MG/ML IJ SOLN
INTRAMUSCULAR | Status: DC | PRN
  Administered 2014-04-01 (×2): 1 mg via INTRAVENOUS

## 2014-04-01 MED ORDER — LIDOCAINE HCL (PF) 2 % IJ SOLN
INTRAMUSCULAR | Status: DC | PRN
  Administered 2014-04-01: 75 mg via INTRADERMAL

## 2014-04-01 MED ORDER — CEFAZOLIN SODIUM-DEXTROSE 2-3 GM-% IV SOLR
2.0000 g | INTRAVENOUS | Status: AC
  Administered 2014-04-01: 2 g via INTRAVENOUS

## 2014-04-01 MED ORDER — ROCURONIUM BROMIDE 100 MG/10ML IV SOLN
INTRAVENOUS | Status: AC
  Filled 2014-04-01: qty 1

## 2014-04-01 MED ORDER — BUPIVACAINE HCL (PF) 0.5 % IJ SOLN
INTRAMUSCULAR | Status: AC
Start: 2014-04-01 — End: 2014-04-01
  Filled 2014-04-01: qty 30

## 2014-04-01 MED ORDER — SODIUM CHLORIDE 0.9 % IJ SOLN: 3.0000 mL | INTRAMUSCULAR | Status: DC | PRN

## 2014-04-01 MED ORDER — GLYCOPYRROLATE 0.2 MG/ML IJ SOLN
INTRAMUSCULAR | Status: AC
  Filled 2014-04-01: qty 2

## 2014-04-01 MED ORDER — PROPOFOL 10 MG/ML IV BOLUS
INTRAVENOUS | Status: DC | PRN
  Administered 2014-04-01: 200 mg via INTRAVENOUS

## 2014-04-01 MED ORDER — MIDAZOLAM HCL 2 MG/2ML IJ SOLN
INTRAMUSCULAR | Status: AC
  Filled 2014-04-01: qty 2

## 2014-04-01 MED ORDER — NEOSTIGMINE METHYLSULFATE 10 MG/10ML IV SOLN
INTRAVENOUS | Status: AC
  Filled 2014-04-01: qty 2

## 2014-04-01 MED ORDER — OXYCODONE HCL 5 MG PO TABS
ORAL_TABLET | ORAL | Status: AC
  Filled 2014-04-01: qty 1

## 2014-04-01 MED ORDER — HYDROMORPHONE HCL PF 1 MG/ML IJ SOLN
INTRAMUSCULAR | Status: AC
  Filled 2014-04-01: qty 1

## 2014-04-01 MED ORDER — CISATRACURIUM BESYLATE 20 MG/10ML IV SOLN
INTRAVENOUS | Status: AC
  Filled 2014-04-01: qty 10

## 2014-04-01 MED ORDER — LIDOCAINE HCL (CARDIAC) 20 MG/ML IV SOLN
INTRAVENOUS | Status: AC
  Filled 2014-04-01: qty 5

## 2014-04-01 MED ORDER — KETOROLAC TROMETHAMINE 30 MG/ML IJ SOLN
INTRAMUSCULAR | Status: AC
  Filled 2014-04-01: qty 1

## 2014-04-01 MED ORDER — SODIUM CHLORIDE 0.9 % IJ SOLN: 3.0000 mL | Freq: Two times a day (BID) | INTRAMUSCULAR | Status: DC

## 2014-04-01 MED ORDER — HYDROMORPHONE HCL PF 1 MG/ML IJ SOLN
1.0000 mg | Freq: Once | INTRAMUSCULAR | Status: AC
  Administered 2014-04-01: 1 mg via INTRAVENOUS
  Filled 2014-04-01: qty 1

## 2014-04-01 SURGICAL SUPPLY — 37 items
ADH SKN CLS APL DERMABOND .7 (GAUZE/BANDAGES/DRESSINGS) ×1
APL SKNCLS STERI-STRIP NONHPOA (GAUZE/BANDAGES/DRESSINGS) ×1
BANDAGE GAUZE ELAST BULKY 4 IN (GAUZE/BANDAGES/DRESSINGS) ×2 IMPLANT
BENZOIN TINCTURE PRP APPL 2/3 (GAUZE/BANDAGES/DRESSINGS) ×3 IMPLANT
BLADE HEX COATED 2.75 (ELECTRODE) ×3 IMPLANT
BLADE SURG 15 STRL LF DISP TIS (BLADE) ×1 IMPLANT
BLADE SURG 15 STRL SS (BLADE) ×3
BNDG GAUZE ELAST 4 BULKY (GAUZE/BANDAGES/DRESSINGS) ×3 IMPLANT
CATH URET 5FR 28IN OPEN ENDED (CATHETERS) IMPLANT
COVER SURGICAL LIGHT HANDLE (MISCELLANEOUS) ×3 IMPLANT
DERMABOND ADVANCED (GAUZE/BANDAGES/DRESSINGS) ×2
DERMABOND ADVANCED .7 DNX12 (GAUZE/BANDAGES/DRESSINGS) IMPLANT
DRAIN PENROSE 18X1/2 LTX STRL (DRAIN) ×3 IMPLANT
DRAIN PENROSE 18X1/4 LTX STRL (WOUND CARE) ×3 IMPLANT
DRAPE LAPAROSCOPIC ABDOMINAL (DRAPES) ×2 IMPLANT
ELECT REM PT RETURN 9FT ADLT (ELECTROSURGICAL)
ELECTRODE REM PT RTRN 9FT ADLT (ELECTROSURGICAL) ×1 IMPLANT
GAUZE SPONGE 4X4 12PLY STRL (GAUZE/BANDAGES/DRESSINGS) ×3 IMPLANT
GLOVE SURG SS PI 8.0 STRL IVOR (GLOVE) IMPLANT
GOWN STRL REUS W/TWL XL LVL3 (GOWN DISPOSABLE) ×3 IMPLANT
KIT BASIN OR (CUSTOM PROCEDURE TRAY) ×3 IMPLANT
NEEDLE HYPO 22GX1.5 SAFETY (NEEDLE) ×2 IMPLANT
NS IRRIG 1000ML POUR BTL (IV SOLUTION) IMPLANT
PACK BASIC VI WITH GOWN DISP (CUSTOM PROCEDURE TRAY) ×1 IMPLANT
PACK GENERAL/GYN (CUSTOM PROCEDURE TRAY) ×2 IMPLANT
PENCIL BUTTON HOLSTER BLD 10FT (ELECTRODE) ×3 IMPLANT
SPONGE LAP 4X18 X RAY DECT (DISPOSABLE) ×2 IMPLANT
SUPPORT SCROTAL LG STRP (MISCELLANEOUS) ×3 IMPLANT
SUPPORTER ATHLETIC LG (MISCELLANEOUS) ×2
SUT CHROMIC 3 0 SH 27 (SUTURE) ×6 IMPLANT
SUT CHROMIC 4 0 SH 27 (SUTURE) IMPLANT
SUT SILK 3 0 SH 30 (SUTURE) ×2 IMPLANT
SUT VIC AB 3-0 SH 27 (SUTURE) ×3
SUT VIC AB 3-0 SH 27XBRD (SUTURE) ×1 IMPLANT
SUT VICRYL 0 TIES 12 18 (SUTURE) ×3 IMPLANT
SYR CONTROL 10ML LL (SYRINGE) IMPLANT
WATER STERILE IRR 1500ML POUR (IV SOLUTION) IMPLANT

## 2014-04-01 NOTE — Transfer of Care (Signed)
Immediate Anesthesia Transfer of Care Note  Patient: Allen Ferrell  Procedure(s) Performed: Procedure(s): scrotal exploration, bilateral orchiopexy  (Bilateral)  Patient Location: PACU  Anesthesia Type:General  Level of Consciousness: awake, alert  and oriented  Airway & Oxygen Therapy: Patient Spontanous Breathing and Patient connected to face mask oxygen  Post-op Assessment: Report given to PACU RN, Post -op Vital signs reviewed and stable and Patient moving all extremities X 4  Post vital signs: Reviewed and stable  Complications: No apparent anesthesia complications

## 2014-04-01 NOTE — Anesthesia Preprocedure Evaluation (Addendum)
Anesthesia Evaluation  Patient identified by MRN, date of birth, ID band Patient awake    Reviewed: Allergy & Precautions, H&P , NPO status , Patient's Chart, lab work & pertinent test results  Airway Mallampati: II TM Distance: >3 FB Neck ROM: Full    Dental  (+) Dental Advisory Given   Pulmonary Current Smoker,  breath sounds clear to auscultation        Cardiovascular negative cardio ROS  Rhythm:Regular Rate:Normal     Neuro/Psych Seizures -,  negative psych ROS   GI/Hepatic negative GI ROS, Neg liver ROS,   Endo/Other  negative endocrine ROS  Renal/GU Renal diseasenegative Renal ROS     Musculoskeletal negative musculoskeletal ROS (+)   Abdominal   Peds  Hematology negative hematology ROS (+)   Anesthesia Other Findings   Reproductive/Obstetrics negative OB ROS                          Anesthesia Physical Anesthesia Plan  ASA: II and emergent  Anesthesia Plan: General   Post-op Pain Management:    Induction: Intravenous, Rapid sequence and Cricoid pressure planned  Airway Management Planned: Oral ETT  Additional Equipment:   Intra-op Plan:   Post-operative Plan: Extubation in OR  Informed Consent: I have reviewed the patients History and Physical, chart, labs and discussed the procedure including the risks, benefits and alternatives for the proposed anesthesia with the patient or authorized representative who has indicated his/her understanding and acceptance.   Dental advisory given  Plan Discussed with: CRNA  Anesthesia Plan Comments:        Anesthesia Quick Evaluation

## 2014-04-01 NOTE — Brief Op Note (Signed)
04/01/2014  12:36 PM  PATIENT:  Elizebeth KollerBobby L Stonehocker  33 y.o. male  PRE-OPERATIVE DIAGNOSIS:  torsion right testicle  POST-OPERATIVE DIAGNOSIS:  torsion right testicle  PROCEDURE:  Procedure(s): scrotal exploration, bilateral orchiopexy  (Bilateral)  SURGEON:  Surgeon(s) and Role:    * Anner CreteJohn J Wrenn, MD - Primary  PHYSICIAN ASSISTANT:   ASSISTANTS: none   ANESTHESIA:   general  EBL:  Total I/O In: 500 [I.V.:500] Out: -   BLOOD ADMINISTERED:none  DRAINS: none   LOCAL MEDICATIONS USED:  NONE  SPECIMEN:  No Specimen  DISPOSITION OF SPECIMEN:  N/A  COUNTS:  YES  TOURNIQUET:  * No tourniquets in log *  DICTATION: .Other Dictation: Dictation Number [id number]  PLAN OF CARE: Discharge to home after PACU  PATIENT DISPOSITION:  PACU - hemodynamically stable.   Delay start of Pharmacological VTE agent (>24hrs) due to surgical blood loss or risk of bleeding: not applicable

## 2014-04-01 NOTE — ED Notes (Signed)
MD at bedside[facility]EDULED FOR SURGERY AT SOME PT. CONSENT SIGNED. PT CALLING FAMILY. PT INFORMED FAMILY NEEDS TO TAKE BELONGINGS. EDP RANCOUR AWARE OF DR. Belva CromeWRENN'S INFORMATION TO THE PT

## 2014-04-01 NOTE — Progress Notes (Signed)
[facility] provided pt with a list of primary care resources to help patient establish a pcp. Patient stated pending Medicaid.

## 2014-04-01 NOTE — Anesthesia Postprocedure Evaluation (Signed)
Anesthesia Post Note  Patient: Allen KollerBobby L Dimmitt  Procedure(s) Performed: Procedure(s) (LRB): scrotal exploration, bilateral orchiopexy  (Bilateral)  Anesthesia type: General  Patient location: PACU  Post pain: Pain level controlled  Post assessment: Post-op Vital signs reviewed  Last Vitals: BP 153/75  Pulse 43  Temp(Src) 37 C (Oral)  Resp 12  Ht 6\' 4"  (1.93 m)  Wt 230 lb (104.327 kg)  BMI 28.01 kg/m2  SpO2 100%  Post vital signs: Reviewed  Level of consciousness: sedated  Complications: No apparent anesthesia complications

## 2014-04-01 NOTE — ED Notes (Signed)
Bed: [id number] Expected date:  Expected time:  Means of arrival:  Comments: EMS/33 yo male stomach cancer

## 2014-04-01 NOTE — ED Notes (Signed)
MD at bedside.  EDP Rancour present to evaluate pt. Made aware of HR EKG ordered

## 2014-04-01 NOTE — ED Notes (Addendum)
Patient is aware that we need a urine specimen.  

## 2014-04-01 NOTE — ED Notes (Signed)
Patient declined Ultrasound wanting pain medication. [facility] went to another room would return

## 2014-04-01 NOTE — ED Provider Notes (Signed)
CSN: [id number]     Arrival date & time 04/01/14  0719 History   First MD Initiated Contact with Patient 04/01/14 [phone]     Chief Complaint  Patient presents with  . Flank Pain     (Consider location/radiation/quality/duration/timing/severity/associated sxs/prior Treatment) HPI Comments: presents for sudden onset of right testicular pain around 4 AM. The pain radiates to his right lower abdomen and right back. Nothing makes it better or worse. Denies any trauma. He has a history of kidney stones but this feels different. He vomited about 10 times. Denies any blood in his emesis. Denies any fever. No Dysuria hematuria. He is still able to urinate. He is not taking anything for pain at home. No previous abdominal surgeries.  The history is provided by the patient. The history is limited by the condition of the patient.    Past Medical History  Diagnosis Date  . Seizures   . Kidney stone     passed   History reviewed. No pertinent past surgical history. History reviewed. No pertinent family history. History  Substance Use Topics  . Smoking status: Current Every Day Smoker -- .5 years    Types: Cigarettes  . Smokeless tobacco: Never Used  . Alcohol Use: Yes     Comment: rarely    Review of Systems  Constitutional: Positive for activity change and appetite change. Negative for fever.  Eyes: Negative for visual disturbance.  Respiratory: Negative for chest tightness and shortness of breath.   Gastrointestinal: Negative for nausea, vomiting and abdominal pain.  Genitourinary: Positive for scrotal swelling and testicular pain. Negative for dysuria and hematuria.  Musculoskeletal: Positive for back pain. Negative for arthralgias and myalgias.  Skin: Negative for rash.  Neurological: Negative for dizziness, weakness and headaches.  A complete 10 system review of systems was obtained and all systems are negative except as noted in the HPI and PMH.      Allergies  Review of  patient's allergies indicates no known allergies.  Home Medications   Prior to Admission medications   Medication Sig Start Date End Date Taking? Authorizing Tiearra Colwell  oxyCODONE-acetaminophen (ROXICET) 5-325 MG per tablet Take 1 tablet by mouth every 4 (four) hours as needed for severe pain. 04/01/14   Anner CreteJohn J Wrenn, MD   BP 149/83  Pulse 52  Temp(Src) 97.6 F (36.4 C) (Oral)  Resp 18  Ht 6\' 4"  (1.93 m)  Wt 230 lb (104.327 kg)  BMI 28.01 kg/m2  SpO2 100% Physical Exam  Nursing note and vitals reviewed. Constitutional: He is oriented to person, place, and time. He appears well-developed and well-nourished. He appears distressed.  Diaphoretic and uncomfortable  HENT:  Head: Normocephalic and atraumatic.  Mouth/Throat: Oropharynx is clear and moist. No oropharyngeal exudate.  Eyes: Conjunctivae and EOM are normal. Pupils are equal, round, and reactive to light.  Neck: Normal range of motion. Neck supple.  No meningismus.  Cardiovascular: Normal rate, regular rhythm, normal heart sounds and intact distal pulses.   No murmur heard. Pulmonary/Chest: Effort normal and breath sounds normal. No respiratory distress.  Abdominal: Soft. There is tenderness. There is no rebound and no guarding.  Right-sided lower abdominal tenderness  Genitourinary:  Right testicle is firm and tender to palpation, somewhat high riding. left testicle is nontender.  Musculoskeletal: Normal range of motion. He exhibits tenderness. He exhibits no edema.  Right CVA tenderness  Neurological: He is alert and oriented to person, place, and time. No cranial nerve deficit. He exhibits normal muscle tone. Coordination normal.  No ataxia on finger to nose bilaterally. No pronator drift. 5/5 strength throughout. CN 2-12 intact. Negative Romberg. Equal grip strength. Sensation intact. Gait is normal.   Skin: Skin is warm.  Psychiatric: He has a normal mood and affect. His behavior is normal.    ED Course  Procedures  (including critical care time) Labs Review Labs Reviewed  CBC WITH DIFFERENTIAL - Abnormal; Notable for the following:    WBC 10.7 (*)    Neutrophils Relative % 85 (*)    Neutro Abs 9.1 (*)    Lymphocytes Relative 8 (*)    All other components within normal limits  BASIC METABOLIC PANEL - Abnormal; Notable for the following:    Potassium 3.6 (*)    Glucose, Bld 112 (*)    All other components within normal limits  URINALYSIS, ROUTINE W REFLEX MICROSCOPIC    Imaging Review Ct Abdomen Pelvis Wo Contrast  04/01/2014   CLINICAL DATA:  Right testicular pain radiating into the groin.  EXAM: CT ABDOMEN AND PELVIS WITHOUT CONTRAST  TECHNIQUE: Multidetector CT imaging of the abdomen and pelvis was performed following the standard protocol without IV contrast.  COMPARISON:  None.  FINDINGS: The lung bases are clear. There is no pleural or pericardial effusion.  There are no renal or ureteral stones. The kidneys, ureters and urinary bladder are all normal in appearance. The gallbladder, liver, spleen, adrenal glands and pancreas appear normal. The stomach, small and large bowel and appendix appear normal. A trace amount of free pelvic fluid is identified. There is no lymphadenopathy. 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  IMPRESSION: Negative for urinary tract stone or hydronephrosis.  Trace amount of free pelvic fluid is nonspecific but could be secondary to enteritis.  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   Electronically Signed   By: Drusilla Kanner M.D.   On: 04/01/2014 09:42   US Scrotum  04/01/2014   CLINICAL DATA:  Acute onset right testicular pain.  EXAM: SCROTAL ULTRASOUND  DOPPLER ULTRASOUND OF THE TESTICLES  TECHNIQUE: Complete ultrasound examination of the testicles, epididymis, and other scrotal structures was performed. Color and spectral Doppler ultrasound were also  utilized to evaluate blood flow to the testicles.  COMPARISON:  None.  FINDINGS: Right testicle  Measurements: 5.8 x 3.2 x 3.4 cm. No mass or microlithiasis visualized.  Left testicle  Measurements: 5.6 x 2.0 x 3.3 cm. No mass or microlithiasis visualized.  Right epididymis:  Tiny cyst containing debris is noted.  Left epididymis:  Normal in size and appearance.  Hydrocele:  None visualized.  Varicocele:  None visualized.  Pulsed Doppler interrogation of both testes demonstrates no arterial waveform for the right testicle. A venous waveform on the right was obtained with great difficulty. The left testicle is unremarkable.  IMPRESSION: Findings worrisome for partial or intermittent right testicular torsion.  Critical Value/emergent results were called by telephone at the time of interpretation on 04/01/2014 at 10:04 AM to Dr. Glynn Octave , who verbally acknowledged these results.   Electronically Signed   By: Drusilla Kanner M.D.   On: 04/01/2014 10:06   [place] Art/ven Flow Abd Pelv Doppler  04/01/2014   CLINICAL DATA:  Acute onset right testicular pain.  EXAM: SCROTAL ULTRASOUND  DOPPLER ULTRASOUND OF THE TESTICLES  TECHNIQUE: Complete ultrasound examination of the testicles, epididymis, and other scrotal structures was performed. Color and spectral Doppler ultrasound were also utilized to  evaluate blood flow to the testicles.  COMPARISON:  None.  FINDINGS: Right testicle  Measurements: 5.8 x 3.2 x 3.4 cm. No mass or microlithiasis visualized.  Left testicle  Measurements: 5.6 x 2.0 x 3.3 cm. No mass or microlithiasis visualized.  Right epididymis:  Tiny cyst containing debris is noted.  Left epididymis:  Normal in size and appearance.  Hydrocele:  None visualized.  Varicocele:  None visualized.  Pulsed Doppler interrogation of both testes demonstrates no arterial waveform for the right testicle. A venous waveform on the right was obtained with great difficulty. The left testicle is unremarkable.  IMPRESSION:  Findings worrisome for partial or intermittent right testicular torsion.  Critical Value/emergent results were called by telephone at the time of interpretation on 04/01/2014 at 10:04 AM to Dr. Glynn OctaveSTEPHEN RANCOUR , who verbally acknowledged these results.   Electronically Signed   By: Drusilla Kannerhomas  Dalessio M.D.   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 Rule out torsion.  Ultrasound ordered, patient n.p.o., pain control and antiemetics given. US ordered immediately after evaluation.  Tech responded quickly but patient refused US until his pain was treated. UA negative.  Found to be bradycardic despite his pain. Sinus on EKG. Previous visits reviewed. Patient's heart rate usually in the 60s.  Intermittent torsion of right testicle on R side, called by radiology at 1004. Dr Annabell HowellsWrenn paged immediately, several times before call returned.  D/w Dr. Annabell HowellsWrenn at 1030 AM.   Dr. Annabell HowellsWrenn has seen patient and will take to OR ASAP.   BP 149/83  Pulse 52  Temp(Src) 97.6 F (36.4 C) (Oral)  Resp 18  Ht 6\' 4"  (1.93 m)  Wt 230 lb (104.327 kg)  BMI 28.01 kg/m2  SpO2 100%   Glynn OctaveStephen Rancour, MD 04/01/14 1751

## 2014-04-01 NOTE — Discharge Instructions (Signed)
Testicular Torsion Testicular torsion is a twisting of the spermatic cord, artery, and vein that go to the testicle. This twisting cuts off the blood supply to everything in the sac that contains the testes, blood vessels, and part of the spermatic cord (scrotum). Testicular torsion is most commonly seen in newborn and adolescent males. It can also occur before birth. Testicular torsion requires emergency treatment. The testicle usually can be saved if the torsion is treated within 6 hours of onset. If the torsion is left untreated for too long, the testicle will die and have to be removed.  CAUSES  Torsion can be caused by a hit on the scrotum or by certain movements during exercise. In some males, testicular torsion is more common because the connection of their testicle to a specific tissue in their scrotum developed in the wrong place, allowing the testicle to rotate and the cord to get twisted.  SIGNS AND SYMPTOMS  The main symptom of testicular torsion is pain in your testicle. The scrotum may be swollen, red, hard, and very tender. There will be excess fluid in the tissue (edema).The testicle may be higher than normal in the scrotum. The skin of the scrotum may be stuck to the testicle. You may have nausea, vomiting, and a fever. DIAGNOSIS  Often testicular torsion is diagnosed through a physical exam. Sometimes imaging exams and tests to measure blood flow may be done. TREATMENT  A manual untwisting of the testicle may be done when the testicle is still mobile and the maneuver is not too painful. However, surgery usually is necessary and should be done as soon as possible after torsion occurs. During surgery, the testicle is untwisted and evaluated and possibly removed.  Document Released: 09/20/2005 Document Revised: 09/25/2013 Document Reviewed: 03/12/2013 [facility] Patient Information 2015 [place], [place]. This information is not intended to replace advice given to you by your health care  provider. Make sure you discuss any questions you have with your health care provider.  Scrotal surgery Refer to this sheet in the next few weeks. These instructions provide you with information on caring for yourself after your procedure. Your health care provider may also give you more specific instructions. Your treatment has been planned according to current medical practices, but problems sometimes occur. Call your health care provider if you have any problems or questions after your procedure. WHAT TO EXPECT AFTER THE PROCEDURE A sterile dressing will be applied to the incision site. You may have a scrotal support. This elevates the scrotum, thereby relieving pressure on the surgical site. In those cases where the scrotal support irritates the incision site, you may be better with the support removed. It is okay if the dressing comes off, especially at night. Air will help a scab to form, which will eliminate the need for dressings during the day. HOME CARE INSTRUCTIONS  Your sterile dressing may be changed once per day or as instructed by your health care provider. If the dressing sticks to your incision site, you may use warm, soapy water or hydrogen peroxide to dampen the bandage. This will loosen the bandage from your skin so that it may be removed.  You may take showers the day after your procedure. Let the water pass gently over the surgery site. Do not rub the site. Pat the area gently or allow to air dry.  Avoid activities that may cause your incision to open.  Do not engage in sexual activity until the area is healed. Usually this will be in  about 10-14 days.  Only take over-the-counter or prescription medicines for pain, discomfort, or fever as directed by your health care provider. SEEK MEDICAL CARE IF:  You experience increasing pain. SEEK IMMEDIATE MEDICAL CARE IF:  You have persistent dizziness or feel sick to your stomach (nausea).  You have difficulty staying awake or  are unable to wake from sleeping.  You have difficulty breathing or a congested cough.  You have a fever or shaking chills.  You have increasing pain or tenderness at the incision site.  You notice pus coming from the incision.  You notice a bad smell coming from the incision or dressing.  You cannot eat or drink or you develop nausea or vomiting.  You have constipation that is not helped by adjusting your diet or increasing fluid intake. Pain medicines are a common cause of constipation.  Your incision breaks open after the sutures have been removed.  You experience pain, swelling, or redness in your genital or groin area. Document Released: 05/23/2013 Document Revised: 09/25/2013 Document Reviewed: 05/23/2013 [facility] Patient Information 2015 [place], [place]. This information is not intended to replace advice given to you by your health care provider. Make sure you discuss any questions you have with your health care provider.

## 2014-04-01 NOTE — Consult Note (Signed)
Subjective: Allen Ferrell presents with the onset a 4am of right testicular pain that was severe with nausea and vomiting.  He was found to have a probable right torsion by US.  He has no prior GU surgery.  ROS:  Review of Systems  Constitutional: Negative for fever.  Gastrointestinal: Positive for nausea and vomiting.  Genitourinary:       Right testicular pain  All other systems reviewed and are negative.   No Known Allergies  Past Medical History  Diagnosis Date  . Seizures   . Kidney stone     passed    History reviewed. No pertinent past surgical history.  History   Social History  . Marital Status: Married    Spouse Name: N/A    Number of Children: N/A  . Years of Education: N/A   Occupational History  . Not on file.   Social History Main Topics  . Smoking status: Current Every Day Smoker -- .5 years    Types: Cigarettes  . Smokeless tobacco: Not on file  . Alcohol Use: Yes     Comment: rarely  . Drug Use: No  . Sexual Activity: Not on file   Other Topics Concern  . Not on file   Social History Narrative  . No narrative on file    No family history on file.   Anti-infectives: Anti-infectives   None      Current Facility-Administered Medications  Medication Dose Route Frequency Provider Last Rate Last Dose  . sodium chloride 0.9 % bolus 1,000 mL  1,000 mL Intravenous Once Glynn OctaveStephen Rancour, MD       No current outpatient prescriptions on file.     Objective: Vital signs in last 24 hours: Temp:  [97.6 F (36.4 C)] 97.6 F (36.4 C) (06/29 0754) Pulse Rate:  [43] 43 (06/29 0754) Resp:  [18-20] 20 (06/29 0754) BP: (147-169)/(62-76) 147/62 mmHg (06/29 0754) SpO2:  [100 %] 100 % (06/29 0754) Weight:  [104.327 kg (230 lb)] 104.327 kg (230 lb) (06/29 0754)  Intake/Output from previous day:   Intake/Output this shift:     Physical Exam  Constitutional: He is oriented to person, place, and time and well-developed, well-nourished, and in no  distress.  HENT:  Head: Normocephalic and atraumatic.  Neck: Normal range of motion. Neck supple.  Cardiovascular: Normal rate, regular rhythm and normal heart sounds.   Pulmonary/Chest: Effort normal and breath sounds normal.  Abdominal: Soft. Bowel sounds are normal. He exhibits no mass. There is no tenderness.  Genitourinary: Penis normal.  Scrotum is normal but the right testicle is high riding and tender.   The left is normal.   Musculoskeletal: Normal range of motion.  Neurological: He is oriented to person, place, and time.  Skin: Skin is warm and dry.  Psychiatric: Mood and affect normal.    Lab Results:   Recent Labs  04/01/14 0813  WBC 10.7*  HGB 13.5  HCT 39.9  PLT 167   BMET  Recent Labs  04/01/14 0813  NA 143  K 3.6*  CL 102  CO2 29  GLUCOSE 112*  BUN 9  CREATININE 0.86  CALCIUM 9.5   PT/INR No results found for this basename: LABPROT, INR,  in the last 72 hours ABG No results found for this basename: PHART, PCO2, PO2, HCO3,  in the last 72 hours  Studies/Results: Ct Abdomen Pelvis Wo Contrast  04/01/2014   CLINICAL DATA:  Right testicular pain radiating into the groin.  EXAM: CT  ABDOMEN AND PELVIS WITHOUT CONTRAST  TECHNIQUE: Multidetector CT imaging of the abdomen and pelvis was performed following the standard protocol without IV contrast.  COMPARISON:  None.  FINDINGS: The lung bases are clear. There is no pleural or pericardial effusion.  There are no renal or ureteral stones. The kidneys, ureters and urinary bladder are all normal in appearance. The gallbladder, liver, spleen, adrenal glands and pancreas appear normal. The stomach, small and large bowel and appendix appear normal. A trace amount of free pelvic fluid is identified. There is no lymphadenopathy. 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  IMPRESSION: Negative for urinary tract stone or hydronephrosis.  Trace amount of free  pelvic fluid is nonspecific but could be secondary to enteritis.  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   Electronically Signed   By: Drusilla Kanner M.D.   On: 04/01/2014 09:42   US Scrotum  04/01/2014   CLINICAL DATA:  Acute onset right testicular pain.  EXAM: SCROTAL ULTRASOUND  DOPPLER ULTRASOUND OF THE TESTICLES  TECHNIQUE: Complete ultrasound examination of the testicles, epididymis, and other scrotal structures was performed. Color and spectral Doppler ultrasound were also utilized to evaluate blood flow to the testicles.  COMPARISON:  None.  FINDINGS: Right testicle  Measurements: 5.8 x 3.2 x 3.4 cm. No mass or microlithiasis visualized.  Left testicle  Measurements: 5.6 x 2.0 x 3.3 cm. No mass or microlithiasis visualized.  Right epididymis:  Tiny cyst containing debris is noted.  Left epididymis:  Normal in size and appearance.  Hydrocele:  None visualized.  Varicocele:  None visualized.  Pulsed Doppler interrogation of both testes demonstrates no arterial waveform for the right testicle. A venous waveform on the right was obtained with great difficulty. The left testicle is unremarkable.  IMPRESSION: Findings worrisome for partial or intermittent right testicular torsion.  Critical Value/emergent results were called by telephone at the time of interpretation on 04/01/2014 at 10:04 AM to Dr. Glynn Octave , who verbally acknowledged these results.   Electronically Signed   By: Drusilla Kanner M.D.   On: 04/01/2014 10:06   [place] Art/ven Flow Abd Pelv Doppler  04/01/2014   CLINICAL DATA:  Acute onset right testicular pain.  EXAM: SCROTAL ULTRASOUND  DOPPLER ULTRASOUND OF THE TESTICLES  TECHNIQUE: Complete ultrasound examination of the testicles, epididymis, and other scrotal structures was performed. Color and spectral Doppler ultrasound were also utilized to evaluate blood flow to the testicles.  COMPARISON:  None.  FINDINGS: Right testicle   Measurements: 5.8 x 3.2 x 3.4 cm. No mass or microlithiasis visualized.  Left testicle  Measurements: 5.6 x 2.0 x 3.3 cm. No mass or microlithiasis visualized.  Right epididymis:  Tiny cyst containing debris is noted.  Left epididymis:  Normal in size and appearance.  Hydrocele:  None visualized.  Varicocele:  None visualized.  Pulsed Doppler interrogation of both testes demonstrates no arterial waveform for the right testicle. A venous waveform on the right was obtained with great difficulty. The left testicle is unremarkable.  IMPRESSION: Findings worrisome for partial or intermittent right testicular torsion.  Critical Value/emergent results were called by telephone at the time of interpretation on 04/01/2014 at 10:04 AM to Dr. Glynn Octave , who verbally acknowledged these results.   Electronically Signed   By: Drusilla Kanner M.D.   On: 04/01/2014 10:06   I have discussed the case with Dr. Lestine Box and reviewed the labs  and x-ray reports.  A UA is pending.   Assessment: Right testicular torsion.  Plan: I am going to get him set up for a scrotal exploration with bilateral orchiopexy.   I have reviewed the risks of bleeding, infection, need for removal of the right testicle, fertility issues, thrombotic events and anesthetic complications.   CC: Dr. Manus Gunningancour.    LOS: 0 days    Anner CreteWRENN,JOHN J 04/01/2014

## 2014-04-02 ENCOUNTER — Encounter (HOSPITAL_COMMUNITY): Payer: Self-pay | Admitting: Urology

## 2014-04-02 NOTE — Op Note (Signed)
NAMMellody Dance:  Ferrell, Allen                 ACCOUNT NO.:  [id number]  MEDICAL RECORD NO.:  [id number]  LOCATION:  [facility]                         [facility]  PHYSICIAN:  Excell SeltzerJohn J. Annabell HowellsWrenn, M.D.    DATE OF BIRTH:  06-11-1981  DATE OF PROCEDURE:  04/01/2014 DATE OF DISCHARGE:  04/01/2014                              OPERATIVE REPORT   PROCEDURE:  Scrotal exploration with bilateral orchidopexy.  PREOPERATIVE DIAGNOSIS:  Right testicular torsion.  POSTOPERATIVE DIAGNOSIS:  Right testicular torsion.  SURGEON:  Excell SeltzerJohn J. Annabell HowellsWrenn, M.D.  ANESTHESIA:  General.  SPECIMEN:  None.  DRAINS:  None.  BLOOD LOSS:  Minimal.  COMPLICATIONS:  None.  INDICATIONS:  Allen Ferrell is a 33 year old white male who had an onset at 4 a.m. of severe right testicular pain with nausea and vomiting. Ultrasound in the ER demonstrated no arterial inflow.  He was felt to have a torsion and he was taken to the operating room for detorsion.  FINDINGS OF PROCEDURE:  He was given 2 g of Ancef.  He was taken to the operating room where general anesthetic was induced with him in the supine position.  He was fitted with PAS hose.  His genitalia was prepped with Betadine solution.  He was draped in usual sterile fashion.  A midline anterior scrotal incision was made with a knife.  This was carried down through the dartos on the right with the Bovie.  The testicle was delivered inside the tunica vaginalis, which was then opened and the testicle was delivered from the tunica vaginalis.  There was approximately a 180-degree torsion of his testicle with a horizontal lie.  The testicle was rather dusky and swollen with hemorrhagic changes of the epididymis, but after detorsion, the body of the testicle was again to pink up.  While observing the right testicle, the left testicle was delivered in an identical fashion.  It also exhibited a horizontal lie and it was felt that bilateral pexy was definitely indicated.  A 3-0 silk sutures on  an SH needle were then used to perform a triangular orchidopexy and a subdartos pouch on the left.  Once this had been done, the left dartos muscle was closed with a running 3-0 chromic. The right testicle now had pinked up sufficiently to leave me the opinion that it was worth attempting to save.  A subdartos pouch was created and a triangular orchidopexy with 3-0 silk was then performed. The right dartos was now closed with a running 3-0 chromic once hemostasis was assured,  The skin was then closed using a running vertical mattress 3-0 chromic. This was reinforced with Dermabond.  I did not do cord blocks for fear of further injury to the testicular blood supply.  Dressing was applied followed by scrotal support.  The patient's anesthetic was reversed.  He was moved to the recovery room in stable condition.  There were no complications.  Of note, there was still a significant chance that this testicle with atrophy, but once again, I felt because of the revascularization on the torsion, it was worth an attempt at salvage.     Excell SeltzerJohn J. Annabell HowellsWrenn, M.D.     JJW/MEDQ  D:  04/01/2014  T:  04/02/2014  Job:  [id number]

## 2014-04-13 ENCOUNTER — Encounter (HOSPITAL_COMMUNITY): Payer: Self-pay | Admitting: Emergency Medicine

## 2014-04-13 ENCOUNTER — Emergency Department (HOSPITAL_COMMUNITY)
Admission: EM | Admit: 2014-04-13 | Discharge: 2014-04-13 | Disposition: A | Discharge status: Home / Self Care | Payer: Medicaid Other | Attending: Emergency Medicine | Admitting: Emergency Medicine

## 2014-04-13 DIAGNOSIS — Y838 Other surgical procedures as the cause of abnormal reaction of the patient, or of later complication, without mention of misadventure at the time of the procedure: Secondary | ICD-10-CM | POA: Diagnosis not present

## 2014-04-13 DIAGNOSIS — T8131XA Disruption of external operation (surgical) wound, not elsewhere classified, initial encounter: Secondary | ICD-10-CM

## 2014-04-13 DIAGNOSIS — F17200 Nicotine dependence, unspecified, uncomplicated: Secondary | ICD-10-CM | POA: Diagnosis not present

## 2014-04-13 DIAGNOSIS — Z87442 Personal history of urinary calculi: Secondary | ICD-10-CM | POA: Insufficient documentation

## 2014-04-13 MED ORDER — HYDROCODONE-ACETAMINOPHEN 5-325 MG PO TABS: 1.0000 | ORAL_TABLET | Freq: Four times a day (QID) | ORAL | Status: AC | PRN

## 2014-04-13 MED ORDER — CEPHALEXIN 500 MG PO CAPS: 500.0000 mg | ORAL_CAPSULE | Freq: Four times a day (QID) | ORAL | Status: AC

## 2014-04-13 MED ORDER — HYDROCODONE-ACETAMINOPHEN 5-325 MG PO TABS
2.0000 | ORAL_TABLET | Freq: Once | ORAL | Status: AC
  Administered 2014-04-13: 2 via ORAL
  Filled 2014-04-13: qty 2

## 2014-04-13 MED ORDER — CEPHALEXIN 500 MG PO CAPS
500.0000 mg | ORAL_CAPSULE | Freq: Once | ORAL | Status: AC
  Administered 2014-04-13: 500 mg via ORAL
  Filled 2014-04-13: qty 1

## 2014-04-13 NOTE — Discharge Instructions (Signed)
Keep wound very clean.  Wash with warm soap and water 2x/day.  You may shower, pad area gently dry.  Do not submerge wound (i.e. Swim or take bath). Follow up with Dr Annabell HowellsWrenn Monday for recheck - call office Monday morning to arrange appointment time. Take motrin or aleve as need for pain. You may also take hydrocodone as need for pain. No driving when taking hydrocodone. Also, do not take tylenol or acetaminophen containing medication when taking hydrocodone. Take keflex (antibiotic) as prescribed. Return to ER if worse, spreading redness, increased swelling, severe pain, fevers, other concern.  You were given pain medication in the ER - no driving for the next 4 hours.    Wound Dehiscence Wound dehiscence is when a surgical cut (incision) opens up and does not heal properly. It usually happens 7-10 days after surgery. You may have bleeding from the cut. You may also have pain or a fever. This condition should be treated early. HOME CARE  Only take medicines as told by your doctor.  Take your antibiotic medicine as told. Finish it even if you start to feel better.  Wash your wound with warm, soapy water 2 times a day, or as told. Pat the wound dry. Do not rub the wound.  Change bandages (dressings) as often as told. Wash your hands before and after changing bandages. Apply bandages as told.  Take showers. Do not soak the wound, bathe, swim, or use a hot tub until directed by your doctor.  Avoid exercises that make you sweat.  Use medicines that stop itching as told by your doctor. The wound may itch as it heals. Do not pick or scratch at the wound.  Do not lift more than 10 pounds (4.5 kilograms) until the wound is healed, or as told by your doctor.  Keep all doctor visits as told. GET HELP IF:  You have a lot of bleeding from your surgical cut.  Your wound does not seem to be healing right.  You have a fever. GET HELP RIGHT AWAY IF:   You have more puffiness (swelling) or  redness around the wound.  You have more pain in the wound.  You have yellowish white fluid (pus) coming from the wound.  More of the wound breaks open. MAKE SURE YOU:   Understand these instructions.  Will watch your condition.  Will get help right away if you are not doing well or get worse. Document Released: 09/08/2009 Document Revised: 09/25/2013 Document Reviewed: 05/28/2013 [facility] Patient Information 2015 [employer]. This information is not intended to replace advice given to you by your health care provider. Make sure you discuss any questions you have with your health care provider.

## 2014-04-13 NOTE — ED Notes (Signed)
Pt states he had testicular torsion surgery on 6/29 and his stitches opened up Thursday night.  States that he used super glue to put it back together but now it looks infected.

## 2014-04-13 NOTE — ED Provider Notes (Signed)
CSN: [id number]     Arrival date & time 04/13/14  0941 History   First MD Initiated Contact with Patient 04/13/14 1005     Chief Complaint  Patient presents with  . Stitches in testicles have "busted open"      (Consider location/radiation/quality/duration/timing/severity/associated sxs/prior Treatment) The history is provided by the patient.  pt w hx testicular torsion, pod 12 s/p scrotal exploration and bilateral orchidopexy, c/o wound dehiscence. States that 2 days ago, the upper few cm of his scrotal incision opened up.   He attempted to clean and superglue back closed, but wound remains open and draining small amount slightly yellowish discharge. No diffuse scrotal swelling or erythema. Is voiding regularly. No nv. No fever or chills.      Past Medical History  Diagnosis Date  . Seizures   . Kidney stone     passed   Past Surgical History  Procedure Laterality Date  . Orchiopexy Bilateral 04/01/2014    Procedure: scrotal exploration, bilateral orchiopexy ;  Surgeon: Anner CreteJohn J Wrenn, MD;  Location: [facility];  Service: Urology;  Laterality: Bilateral;   No family history on file. History  Substance Use Topics  . Smoking status: Current Every Day Smoker -- .5 years    Types: Cigarettes  . Smokeless tobacco: Never Used  . Alcohol Use: Yes     Comment: rarely    Review of Systems  Constitutional: Negative for fever and chills.  Gastrointestinal: Negative for vomiting and abdominal pain.  Genitourinary: Negative for dysuria.  Skin: Negative for rash.      Allergies  Review of patient's allergies indicates no known allergies.  Home Medications   Prior to Admission medications   Medication Sig Start Date End Date Taking? Authorizing Provider  oxyCODONE-acetaminophen (ROXICET) 5-325 MG per tablet Take 1 tablet by mouth every 4 (four) hours as needed for severe pain. 04/01/14  Yes Anner CreteJohn J Wrenn, MD   BP 131/77  Pulse 80  Temp(Src) 98 F (36.7 C) (Oral)  Resp 18  SpO2  100% Physical Exam  Nursing note and vitals reviewed. Constitutional: He is oriented to person, place, and time. He appears well-developed and well-nourished. No distress.  HENT:  Mouth/Throat: Oropharynx is clear and moist.  Eyes: Conjunctivae are normal.  Neck: Neck supple. No tracheal deviation present.  Cardiovascular: Normal rate.   Pulmonary/Chest: Effort normal. No accessory muscle usage. No respiratory distress.  Abdominal: Soft. Bowel sounds are normal. He exhibits no distension. There is no tenderness.  Genitourinary:  Superior aspect scrotal incision has dehisced, w 3 cm open wound.  Minimal sl yellow drainage from wound.  See attached photo. No diffuse scrotal cellulitis. Penis appears normal.   Musculoskeletal: Normal range of motion.  Neurological: He is alert and oriented to person, place, and time.  Skin: Skin is warm and dry. He is not diaphoretic.  Psychiatric: He has a normal mood and affect.    ED Course  Procedures (including critical care time) Labs Review   MDM  Wound cleaned, moist to dry sterile dressing.  Discussed w urology on call.  Reviewed nursing notes and prior charts for additional history.   Discussed pt with urology on call for Dr Annabell HowellsWrenn, Dr Brunilda PayorNesi - he indicates just apply dressing, po abx, and have f/u in office this week.   Pt requests pain rx, and med in ED - states gp drove him, does not have to drive. No meds pta.  vicodin po.  Keflex po.  Suzi Roots, MD 04/13/14 1040

## 2014-09-22 IMAGING — CR DG SHOULDER 2+V*R*
2 series · 2 of 2 positions shown · non-contrast
Comparison: Prior radiograph performed earlier on the same day.

CLINICAL DATA: Postreduction

RIGHT SHOULDER - 2+ VIEW

[x shoulder ap right]
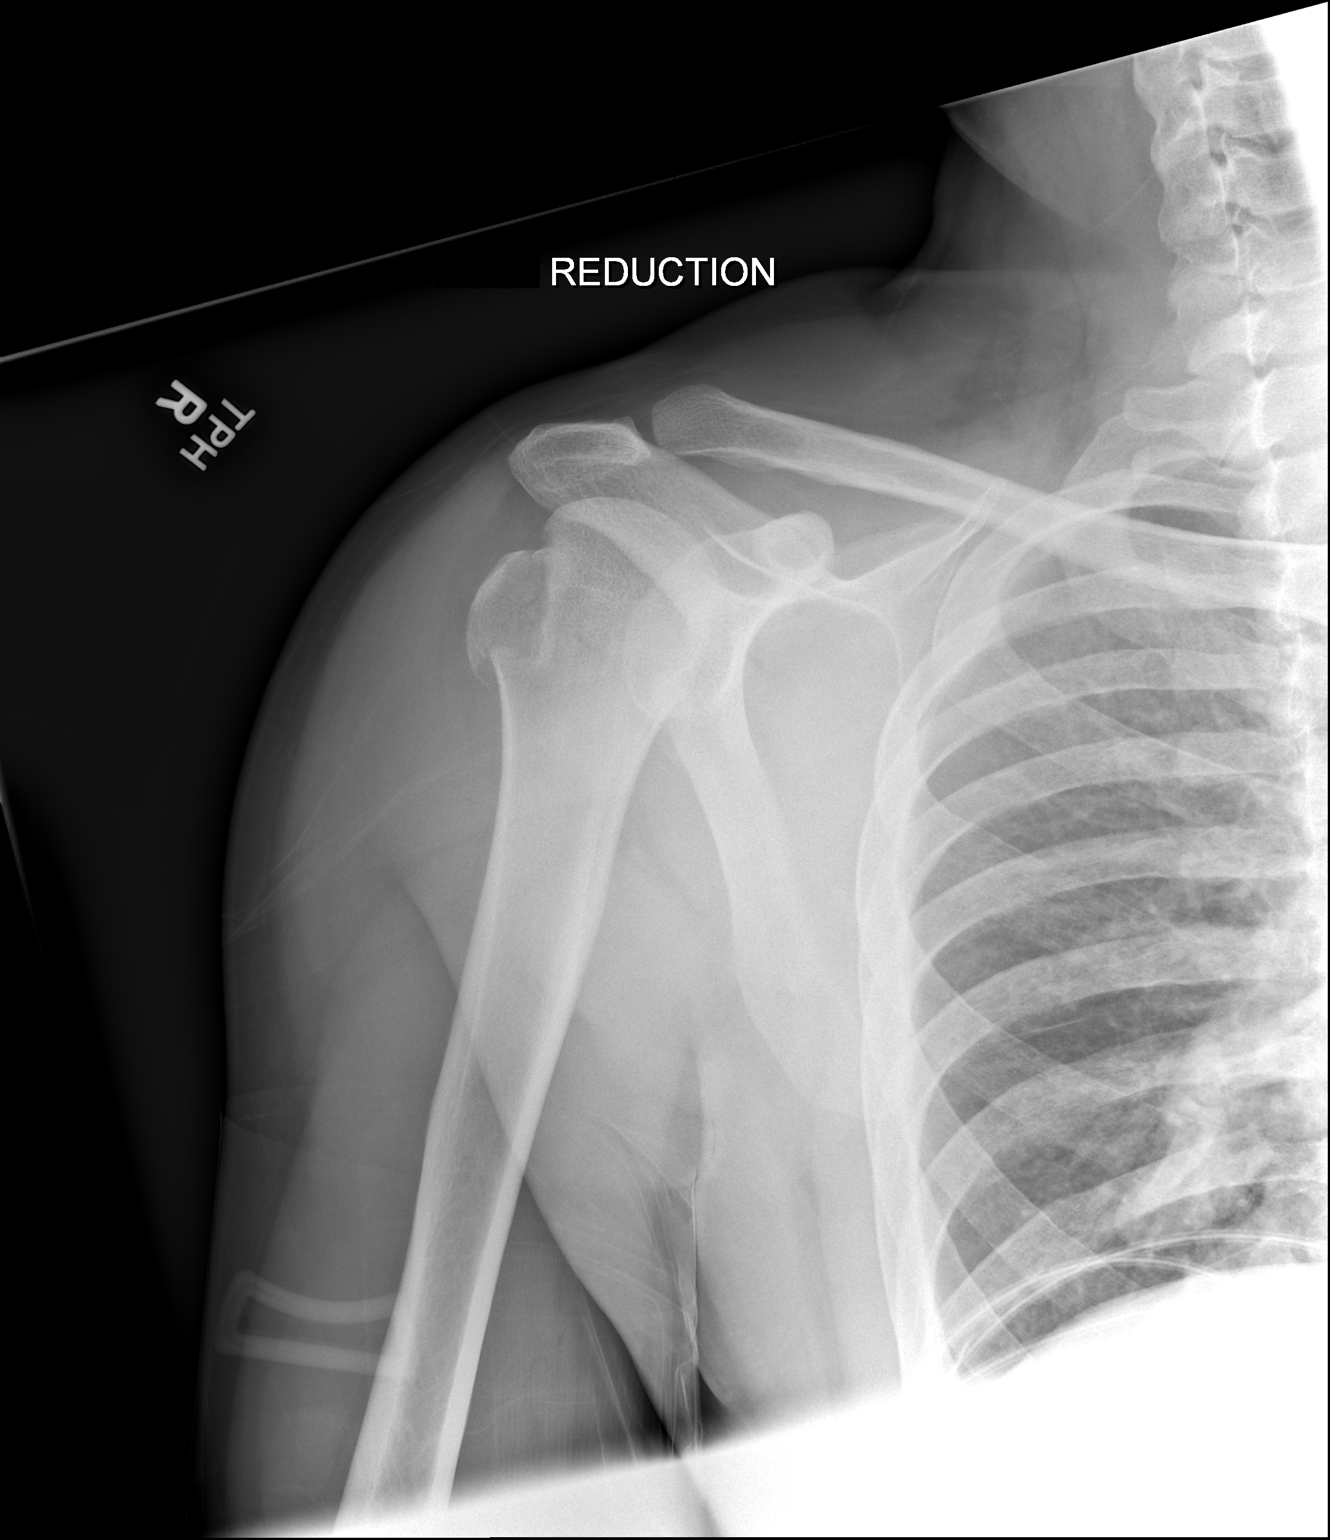

[x scapula y-view right]
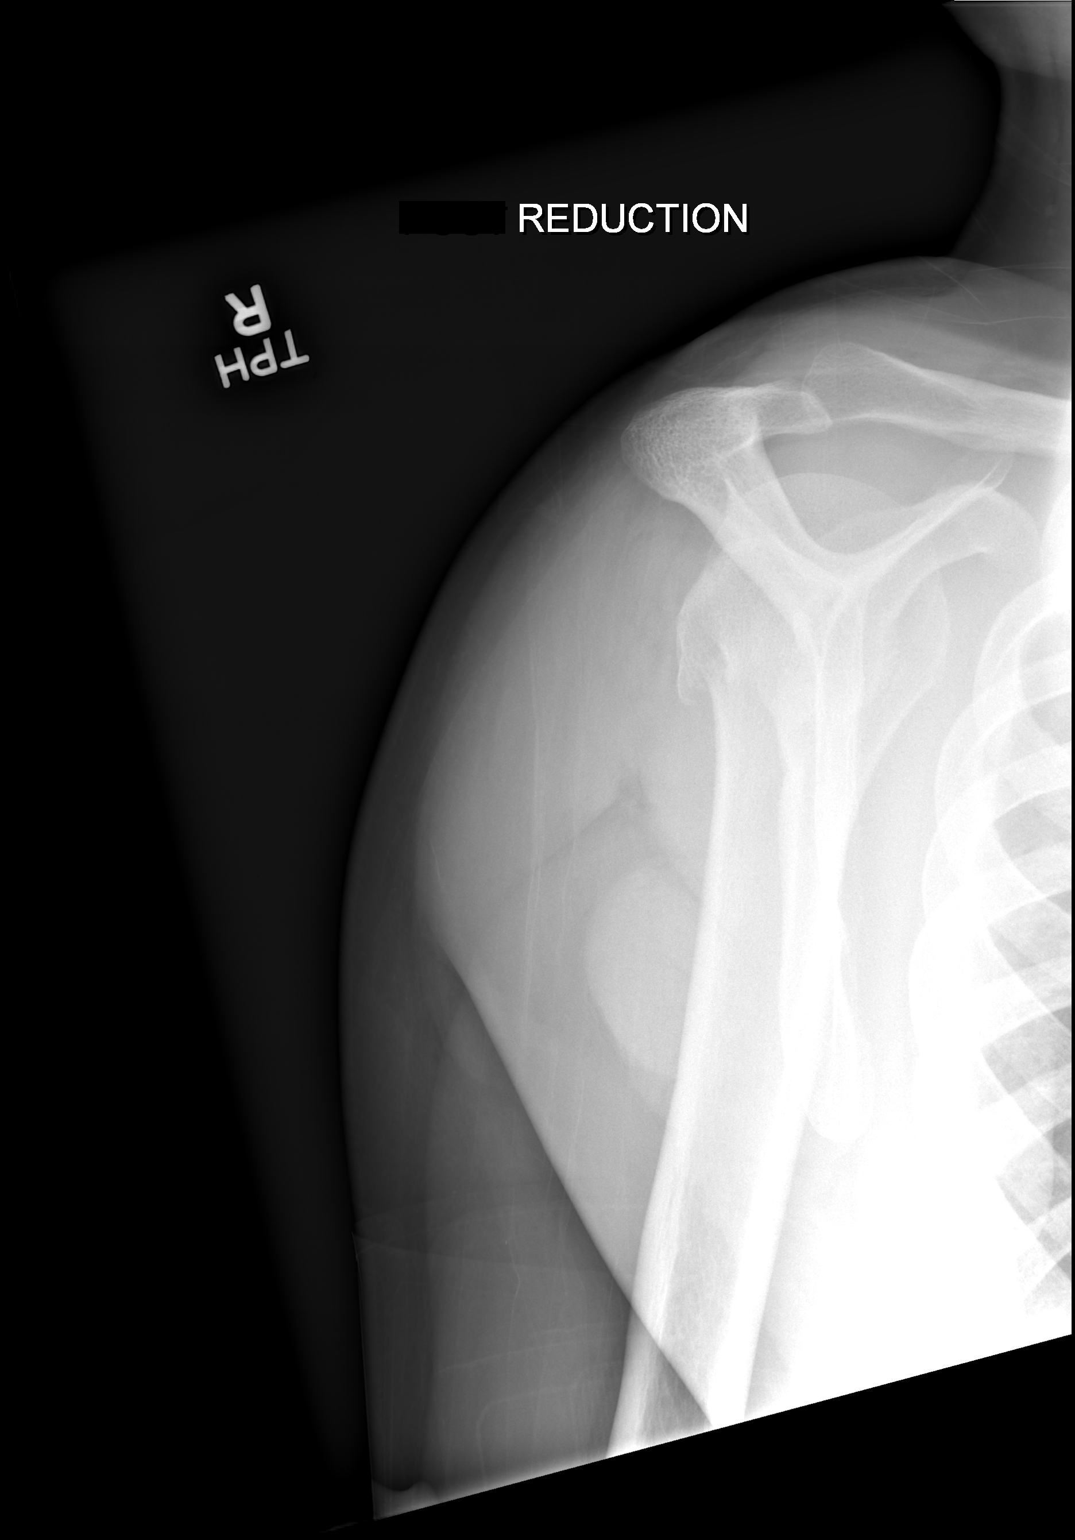

[2 of 2 positions shown; findings below may reference images not displayed]

FINDINGS: The humeral head is now in gross anatomic alignment with
the glenoid status post reduction.  Previously identified impacted
Hill-Sachs fracture is again noted, not significantly changed.
Displaced greater trochanter fracture is grossly stable as well.
No new fracture identified.  AC joint is approximated.
IMPRESSION: 1.  Humeral head in gross anatomic alignment status post reduction.
2.  Stable appearance of impacted Hill-Sachs fracture with
displaced greater trochanter fracture fragment.

## 2014-09-22 IMAGING — CR DG SHOULDER 2+V*L*
1 series · 1 of 1 positions shown · non-contrast
Comparison: Prior radiograph performed earlier on the same day.

CLINICAL DATA: Postreduction

LEFT SHOULDER - 2+ VIEW

[x shoulder ap right]
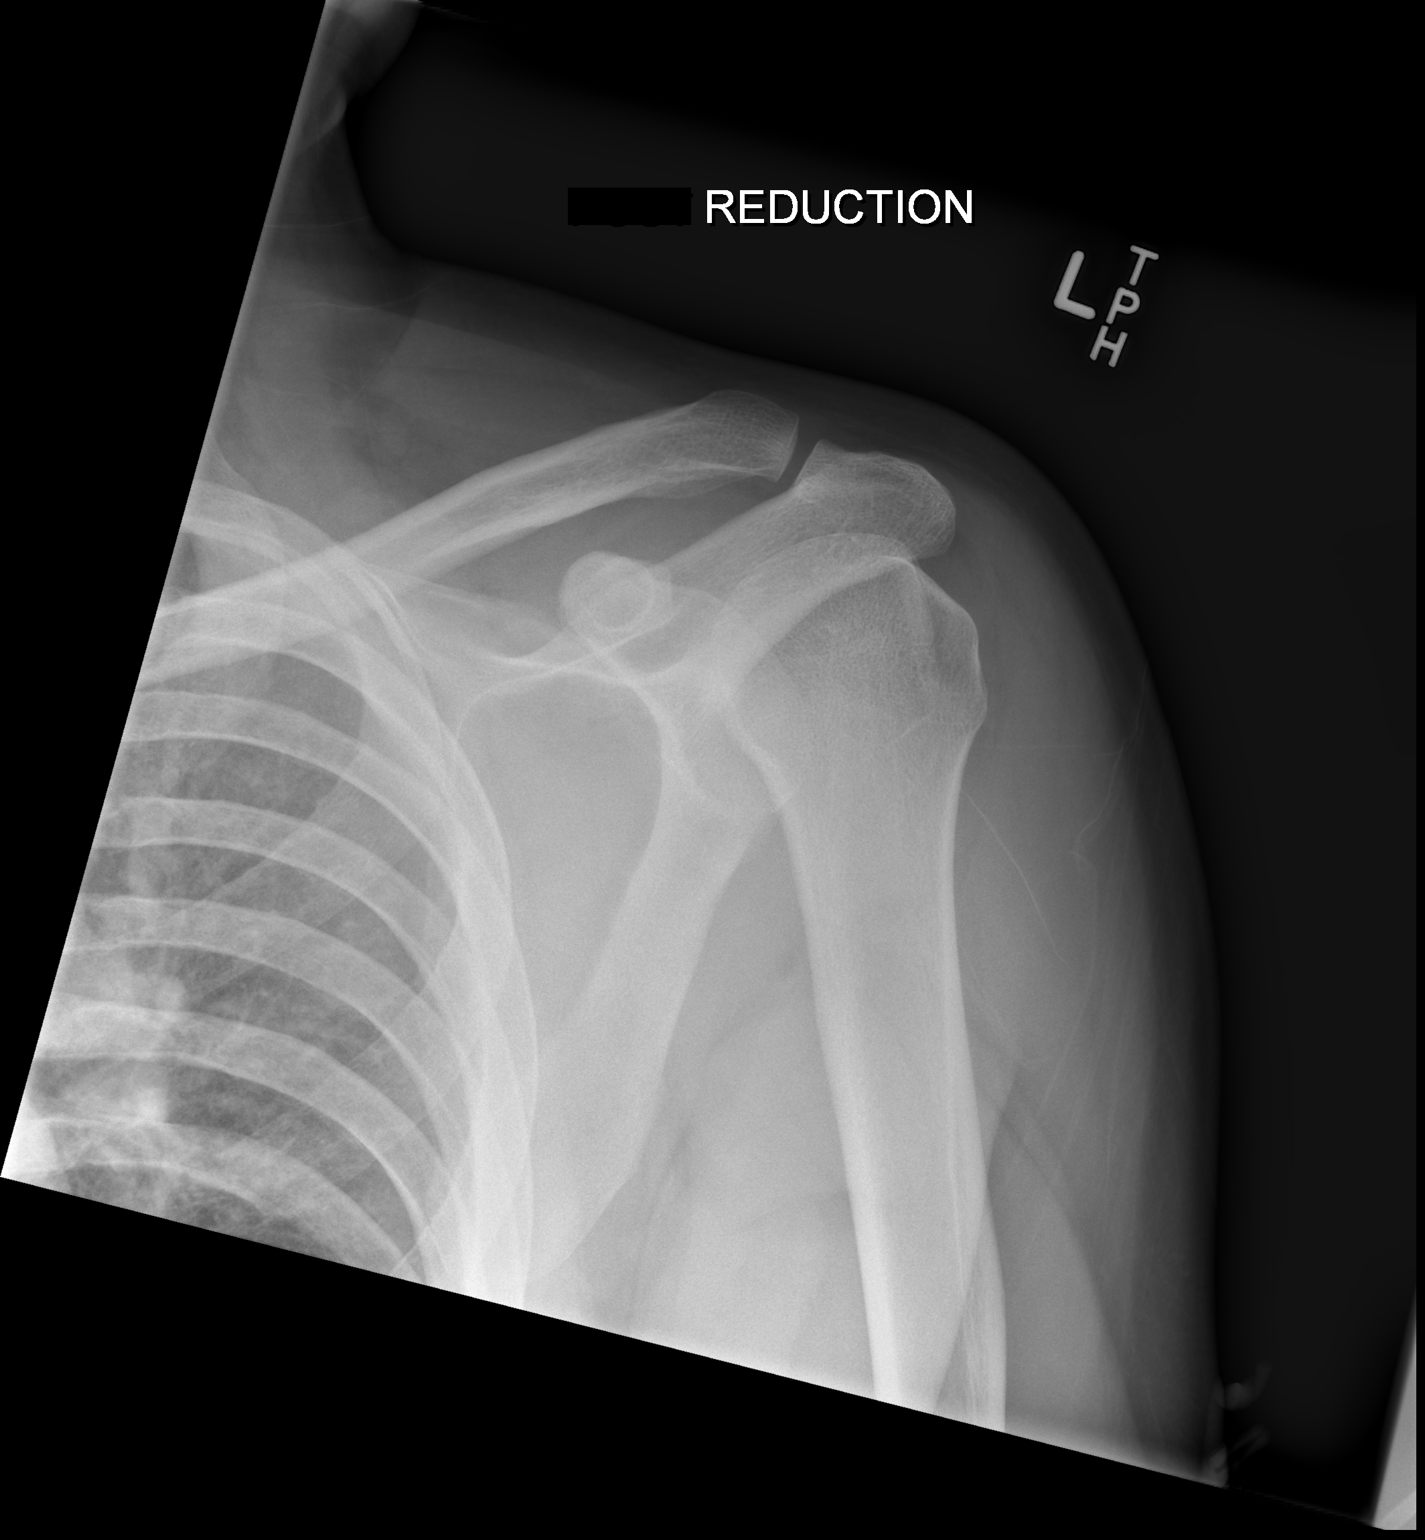

[1 of 1 positions shown; findings below may reference images not displayed]

FINDINGS: The humeral head is now in normal anatomic alignment with the
glenoid.}, there is question of a mild associated Hill-Sachs
deformity.  No Bankart lesion is identified.  AC joint is
approximated.
IMPRESSION: 1.  Humeral head in normal anatomic alignment status post
reduction.
2.  Question mild Hill-Sachs deformity.

## 2015-08-05 IMAGING — CT CT ABD-PELV W/O CM
1 series · 15 of 23 positions shown, 20 images · non-contrast
Comparison: None.

CLINICAL DATA: Right testicular pain radiating into the groin.

EXAM:
CT ABDOMEN AND PELVIS WITHOUT CONTRAST
TECHNIQUE: Multidetector CT imaging of the abdomen and pelvis was performed
following the standard protocol without IV contrast.

[Series 6: lung · axial · 0.85mm/px · z∈[-188,-88]mm · 15 of 23 slices shown, 20 images]
[im 2/23  soft-tissue]
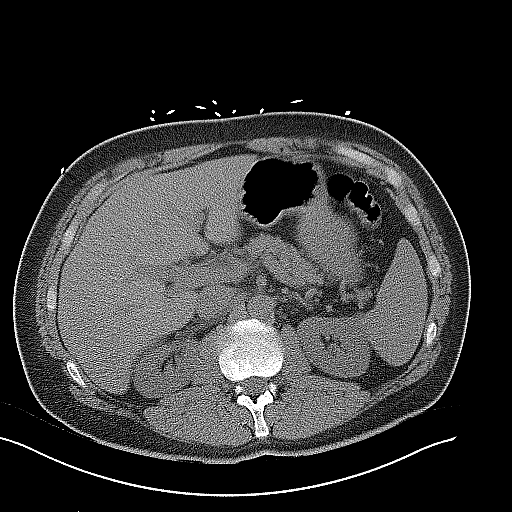
[im 2/23  bone]
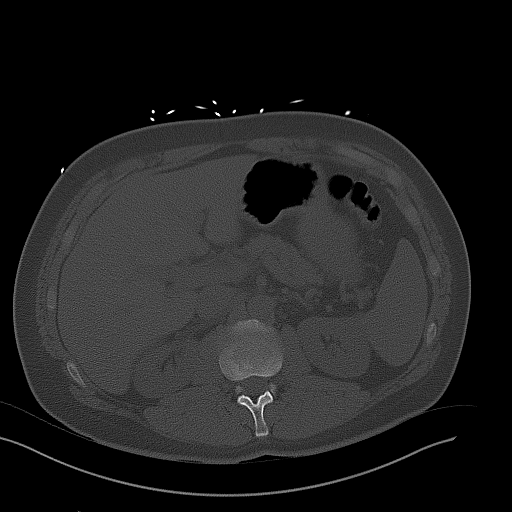
[im 4/23  soft-tissue]
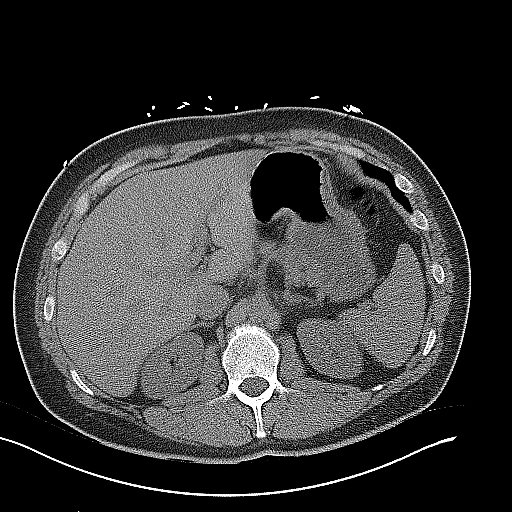
[im 5/23  soft-tissue]
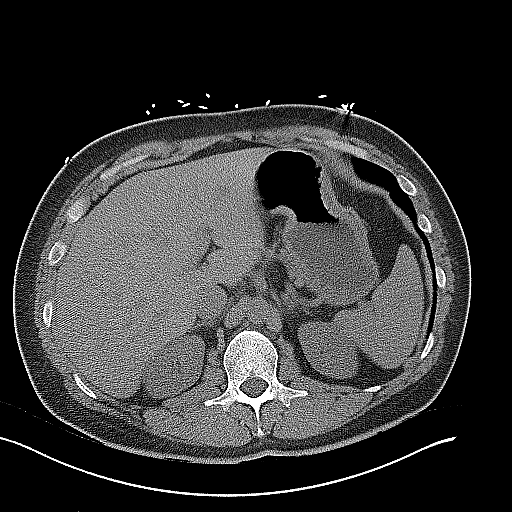
[im 7/23  soft-tissue]
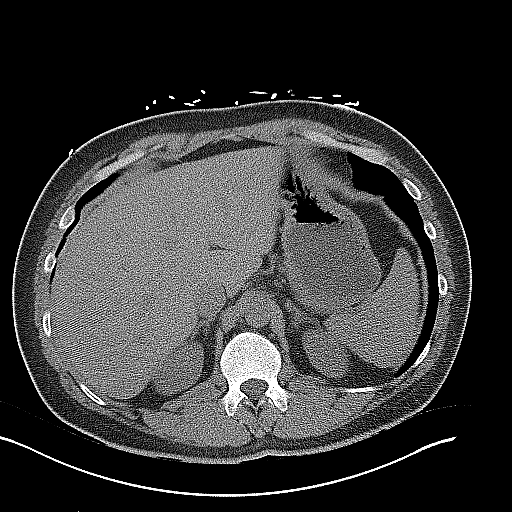
[im 8/23  soft-tissue]
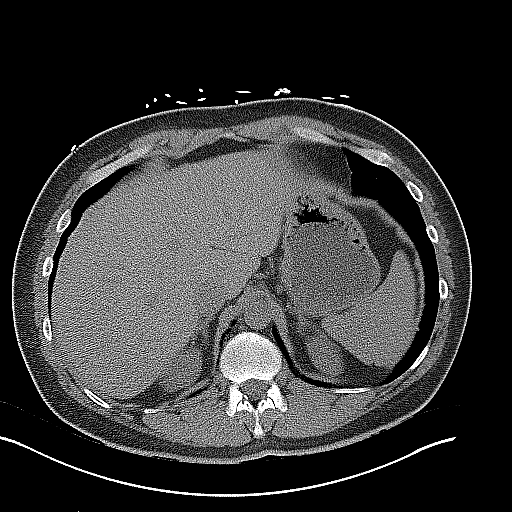
[im 10/23  soft-tissue]
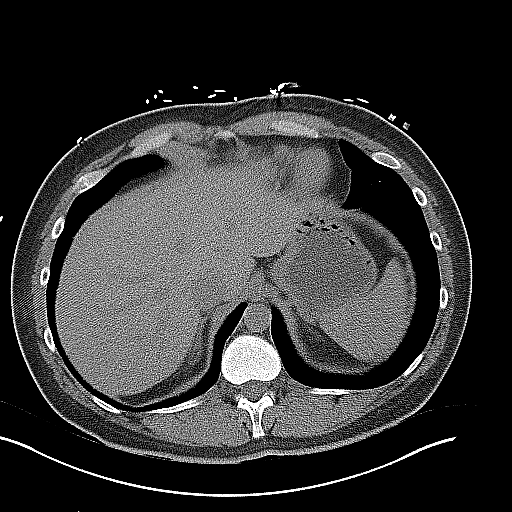
[im 11/23  soft-tissue]
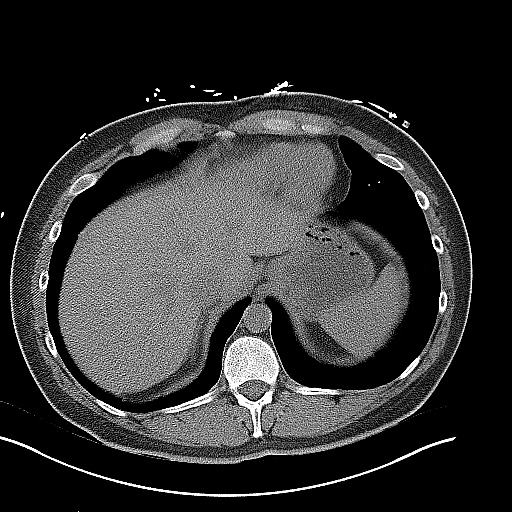
[im 13/23  soft-tissue]
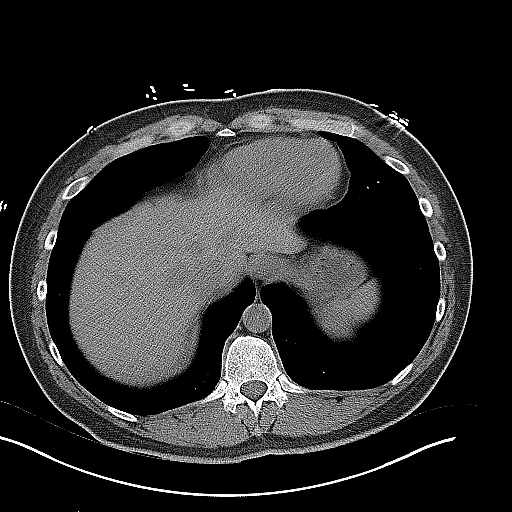
[im 14/23  soft-tissue]
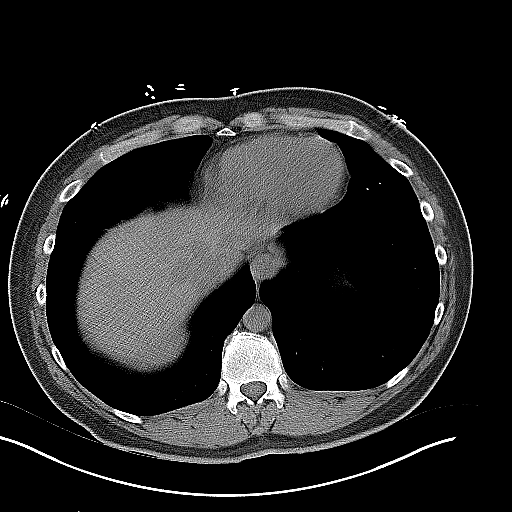
[im 14/23  bone]
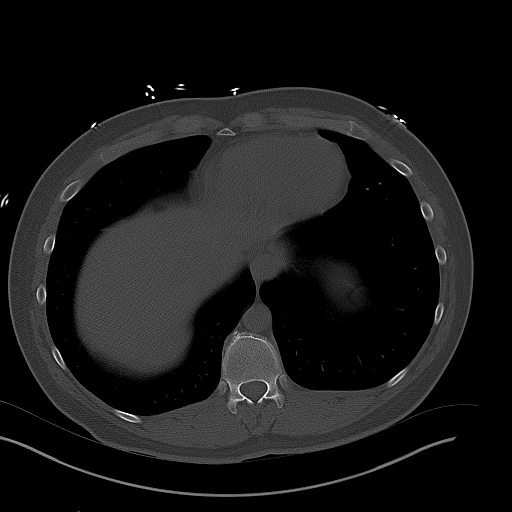
[im 16/23  soft-tissue]
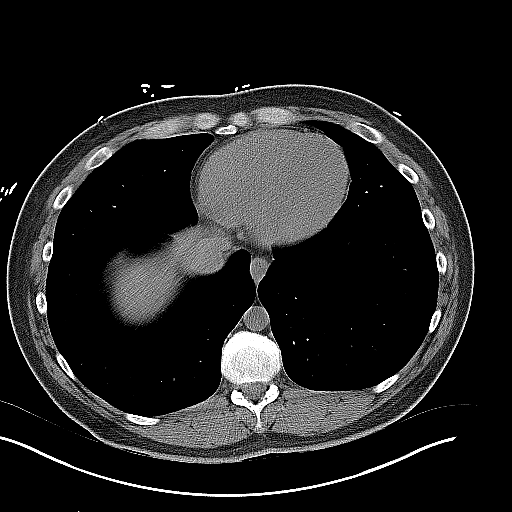
[im 17/23  soft-tissue]
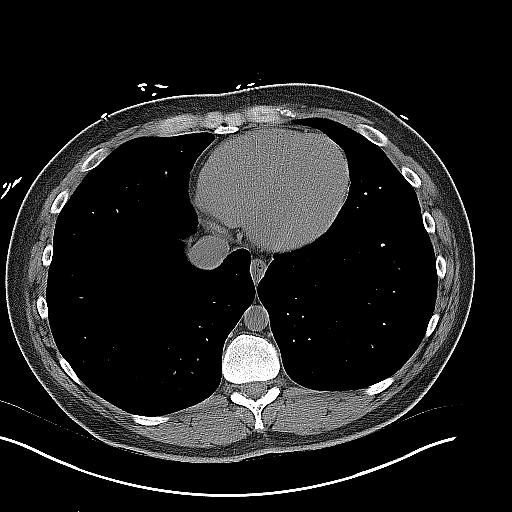
[im 19/23  soft-tissue]
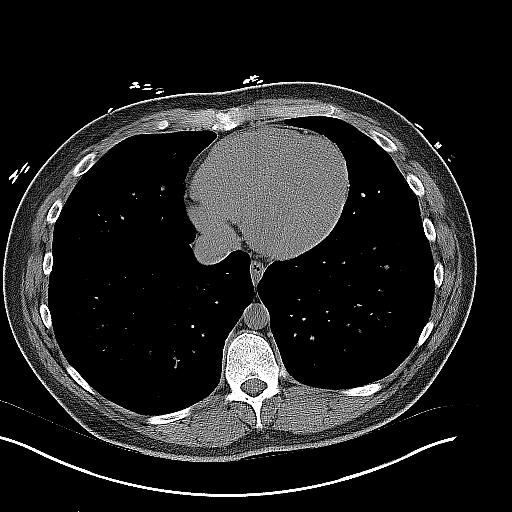
[im 19/23  lung]
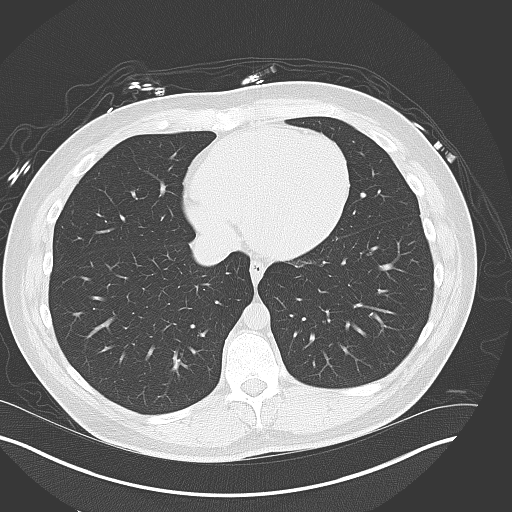
[im 20/23  soft-tissue]
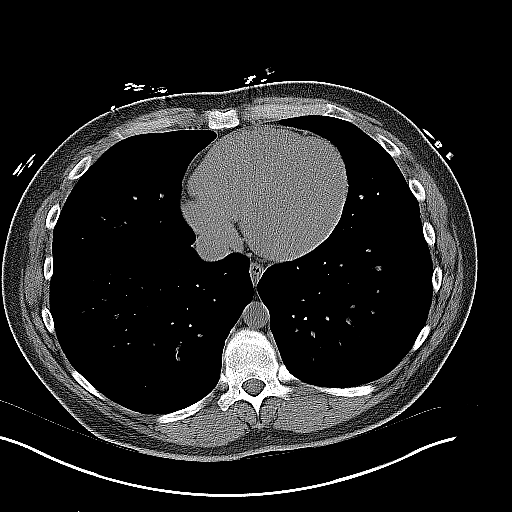
[im 20/23  lung]
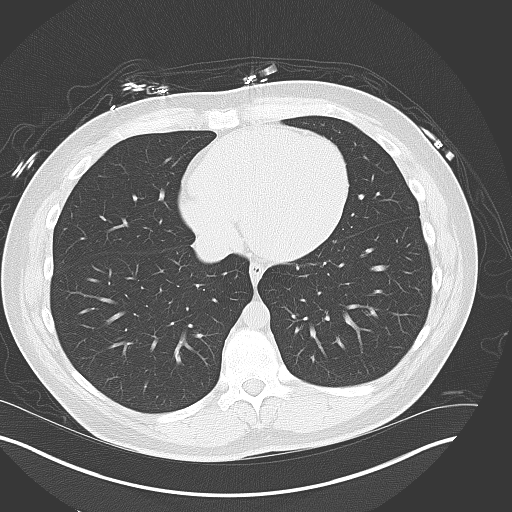
[im 21/23  lung]
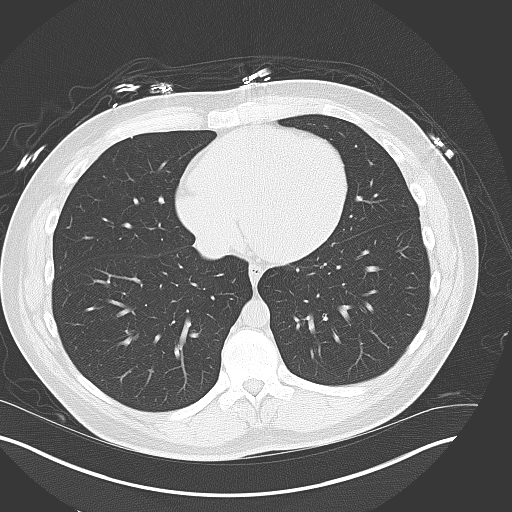
[im 22/23  soft-tissue]
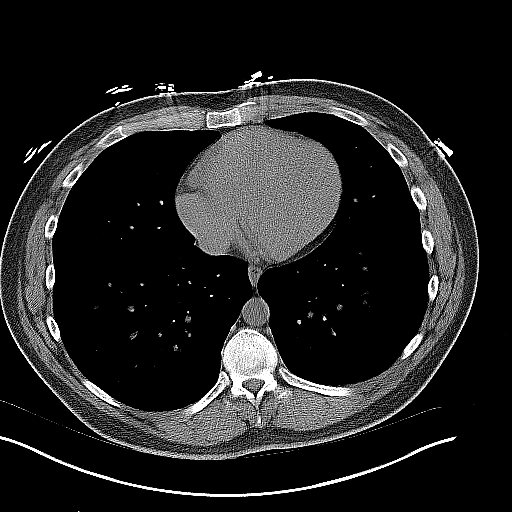
[im 22/23  lung]
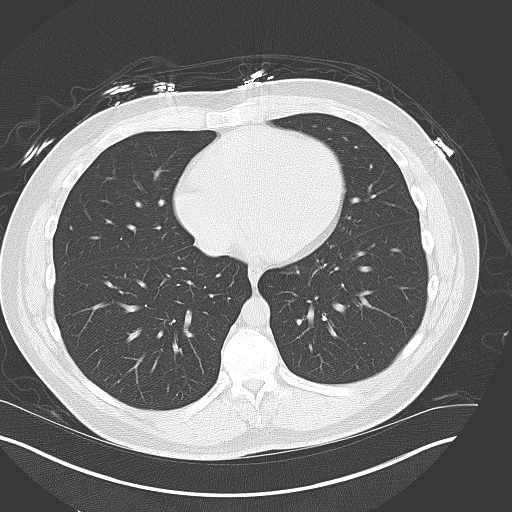

[15 of 23 positions shown; findings below may reference images not displayed]

FINDINGS: The lung bases are clear. There is no pleural or pericardial
effusion.

There are no renal or ureteral stones. The kidneys, ureters and
urinary bladder are all normal in appearance. The gallbladder,
liver, spleen, adrenal glands and pancreas appear normal. The
stomach, small and large bowel and appendix appear normal. A trace
amount of free pelvic fluid is identified. There is no
lymphadenopathy. The patient has bilateral L5 pars interarticularis
defects with associated 0.5 cm anterolisthesis L5 on S1.
IMPRESSION: Negative for urinary tract stone or hydronephrosis.

Trace amount of free pelvic fluid is nonspecific but could be
secondary to enteritis.

Bilateral L5 pars interarticularis defects with associated 0.5 cm
anterolisthesis L5 on S1.
